# Patient Record
Sex: Male | Born: 1945 | Race: White | Hispanic: No | Marital: Married | Smoking: Never smoker
Health system: Southern US, Community
[De-identification: ages and names within clinical notes are randomized; demographics above are authoritative.]

## PROBLEM LIST (undated history)

## (undated) DIAGNOSIS — F419 Anxiety disorder, unspecified: Secondary | ICD-10-CM

## (undated) DIAGNOSIS — F319 Bipolar disorder, unspecified: Secondary | ICD-10-CM

## (undated) DIAGNOSIS — F32A Depression, unspecified: Secondary | ICD-10-CM

## (undated) DIAGNOSIS — E782 Mixed hyperlipidemia: Secondary | ICD-10-CM

## (undated) DIAGNOSIS — N189 Chronic kidney disease, unspecified: Secondary | ICD-10-CM

## (undated) DIAGNOSIS — F3181 Bipolar II disorder: Secondary | ICD-10-CM

## (undated) DIAGNOSIS — E785 Hyperlipidemia, unspecified: Secondary | ICD-10-CM

## (undated) HISTORY — DX: Mixed hyperlipidemia: E78.2

## (undated) HISTORY — DX: Bipolar disorder, unspecified: F31.9

## (undated) HISTORY — PX: INGUINAL HERNIA REPAIR: SUR1180

## (undated) HISTORY — PX: APPENDECTOMY: SHX54

---

## 2004-08-05 ENCOUNTER — Ambulatory Visit: Payer: Self-pay | Admitting: Cardiology

## 2005-06-01 ENCOUNTER — Ambulatory Visit: Payer: Self-pay | Admitting: Cardiology

## 2005-06-01 ENCOUNTER — Ambulatory Visit: Payer: Self-pay

## 2006-06-21 ENCOUNTER — Ambulatory Visit: Payer: Self-pay | Admitting: Cardiology

## 2007-06-20 ENCOUNTER — Ambulatory Visit: Payer: Self-pay | Admitting: Cardiology

## 2008-06-18 ENCOUNTER — Ambulatory Visit: Payer: Self-pay | Admitting: Cardiology

## 2008-06-18 ENCOUNTER — Ambulatory Visit: Payer: Self-pay

## 2009-05-02 ENCOUNTER — Telehealth: Payer: Self-pay | Admitting: Cardiology

## 2009-05-05 DIAGNOSIS — E782 Mixed hyperlipidemia: Secondary | ICD-10-CM | POA: Insufficient documentation

## 2009-05-06 ENCOUNTER — Encounter: Payer: Self-pay | Admitting: Cardiology

## 2009-05-06 ENCOUNTER — Encounter (INDEPENDENT_AMBULATORY_CARE_PROVIDER_SITE_OTHER): Payer: Self-pay | Admitting: *Deleted

## 2009-05-06 LAB — CONVERTED CEMR LAB
BUN: 17 mg/dL
Creatinine, Ser: 1.43 mg/dL
Glucose, Bld: 88 mg/dL
HCT: 43.3 %
Hemoglobin: 14.9 g/dL
PSA: 0.97 ng/mL
Potassium: 4.3 meq/L
Triglyceride fasting, serum: 31 mg/dL
Vit D, 25-Hydroxy: 49.8 ng/mL

## 2009-05-08 ENCOUNTER — Encounter (INDEPENDENT_AMBULATORY_CARE_PROVIDER_SITE_OTHER): Payer: Self-pay | Admitting: *Deleted

## 2009-05-14 ENCOUNTER — Telehealth: Payer: Self-pay | Admitting: Cardiology

## 2009-05-20 ENCOUNTER — Encounter: Payer: Self-pay | Admitting: Cardiology

## 2009-05-26 ENCOUNTER — Telehealth: Payer: Self-pay | Admitting: Cardiology

## 2009-06-06 ENCOUNTER — Telehealth: Payer: Self-pay | Admitting: Cardiology

## 2009-06-18 ENCOUNTER — Ambulatory Visit: Payer: Self-pay | Admitting: Cardiology

## 2009-06-20 ENCOUNTER — Encounter: Payer: Self-pay | Admitting: Cardiology

## 2009-08-21 ENCOUNTER — Encounter (INDEPENDENT_AMBULATORY_CARE_PROVIDER_SITE_OTHER): Payer: Self-pay | Admitting: *Deleted

## 2009-08-25 ENCOUNTER — Ambulatory Visit: Payer: Self-pay | Admitting: Gastroenterology

## 2009-09-04 ENCOUNTER — Encounter: Payer: Self-pay | Admitting: Cardiology

## 2009-09-05 ENCOUNTER — Ambulatory Visit: Payer: Self-pay | Admitting: Gastroenterology

## 2009-10-17 ENCOUNTER — Telehealth: Payer: Self-pay | Admitting: Cardiology

## 2010-05-18 ENCOUNTER — Telehealth: Payer: Self-pay | Admitting: Cardiology

## 2010-06-22 ENCOUNTER — Encounter: Payer: Self-pay | Admitting: Cardiology

## 2010-06-22 ENCOUNTER — Telehealth: Payer: Self-pay | Admitting: Cardiology

## 2010-07-13 ENCOUNTER — Encounter: Payer: Self-pay | Admitting: Cardiology

## 2010-08-06 ENCOUNTER — Encounter (HOSPITAL_COMMUNITY)
Admission: RE | Admit: 2010-08-06 | Discharge: 2010-10-03 | Payer: Self-pay | Source: Home / Self Care | Attending: Cardiology | Admitting: Cardiology

## 2010-08-06 ENCOUNTER — Encounter: Payer: Self-pay | Admitting: *Deleted

## 2010-08-06 ENCOUNTER — Ambulatory Visit: Payer: Self-pay | Admitting: Cardiology

## 2010-08-06 ENCOUNTER — Ambulatory Visit: Payer: Self-pay

## 2010-08-07 ENCOUNTER — Telehealth: Payer: Self-pay | Admitting: Cardiology

## 2010-11-04 NOTE — Progress Notes (Signed)
Summary: talk to nurse  Phone Note Call from Patient Call back at Home Phone [phone]   Caller: Patient Reason for Call: Talk to Nurse Summary of Call: Pt would like to speak with nurse. personal issue about meds. [phone] or [phone] Initial call taken by: Edman Circle,  October 17, 2009 10:31 AM    Prescriptions: LEVITRA 20 MG TABS (VARDENAFIL HCL)   #30 x 0   Entered by:   Scherrie Bateman, LPN   Authorized by:   Gaylord Shih, MD, [facility]   Signed by:   Scherrie Bateman, LPN on 57/84/6962   Method used:   Faxed to ...       [facility] Environmental education officer)       [address] [address]       [place], [place]  [address]       Ph: [phone]       Fax: [phone]   RxID:   [id number]   Appended Document: talk to nurse needed refill done through [facility].

## 2010-11-04 NOTE — Progress Notes (Signed)
Summary: qx about meds  Phone Note Call from Patient   Summary of Call: pt has a question concerning his medications [phone] Initial call taken by: Edman Circle,  August 07, 2010 8:59 AM  Follow-up for Phone Call        Dr. Tawanna Sat calls today for a new Lovaza prescription to [facility].  Prescription sent. Mylo Red RN

## 2010-11-04 NOTE — Assessment & Plan Note (Signed)
Summary: Cardiology Nuclear Testing  Nuclear Med Background Indications for Stress Test: Evaluation for Ischemia   History: COPD, GXT, Myocardial Perfusion Study  History Comments: 9/10 GXT: NL. 06/18/08 MPS: NL,EF=65%.  Symptoms: DOE, Palpitations    Nuclear Pre-Procedure Cardiac Risk Factors: Family History - CAD, Lipids Caffeine/Decaff Intake: None NPO After: 8:00 AM Lungs: clear IV 0.9% NS with Angio Cath: 22g     IV Site: R Hand IV Started by: Doyne Keel, CNMT Chest Size (in) 40     Height (in): 70 Weight (lb): 157 BMI: 22.61  Nuclear Med Study 1 or 2 day study:  1 day     Stress Test Type:  Stress Reading MD:  Cassell Clement, MD     Referring MD:  T.Wall Resting Radionuclide:  Technetium 27m Tetrofosmin     Resting Radionuclide Dose:  11 mCi  Stress Radionuclide:  Technetium 110m Tetrofosmin     Stress Radionuclide Dose:  32.9 mCi   Stress Protocol Exercise Time (min):  13:35 min     Max HR:  173 bpm     Predicted Max HR:  154 bpm  Max Systolic BP: 205 mm Hg     Percent Max HR:  112.34 %     METS: 16.4 Rate Pressure Product:  14782    Stress Test Technologist:  Milana Na, EMT-P     Nuclear Technologist:  Doyne Keel, CNMT  Rest Procedure  Myocardial perfusion imaging was performed at rest 45 minutes following the intravenous administration of Technetium 8m Tetrofosmin.  Stress Procedure  The patient exercised for 13:35. The patient stopped due to fatigue and denied any chest pain.  There were no significant ST-T wave changes and rare pvcs.  Technetium 76m Tetrofosmin was injected at peak exercise and myocardial perfusion imaging was performed after a brief delay.  QPS Raw Data Images:  Normal; no motion artifact; normal heart/lung ratio. Stress Images:  Normal homogeneous uptake in all areas of the myocardium. Rest Images:  Normal homogeneous uptake in all areas of the myocardium. Subtraction (SDS):  No evidence of ischemia. Transient Ischemic  Dilatation:  0.95  (Normal <1.22)  Lung/Heart Ratio:  0.26  (Normal <0.45)  Quantitative Gated Spect Images QGS EDV:  72 ml QGS ESV:  28 ml QGS EF:  61 % QGS cine images:  Normal LV systolic function.  No wall motion abnormalities.  Findings Normal nuclear study      Overall Impression  Exercise Capacity: Excellent exercise capacity. BP Response: Normal blood pressure response. Clinical Symptoms: No chest pain ECG Impression: No significant ST segment change suggestive of ischemia. Overall Impression: Normal stress nuclear study.  Appended Document: Cardiology Nuclear Testing DISCUSSED IN OFFICE WITH PT, CALL MONDAY TO GIVE FINAL REPORT.  Appended Document: Cardiology Nuclear Testing Pt aware of results. Mylo Red RN

## 2010-11-04 NOTE — Progress Notes (Signed)
Summary: set up stress test prior to appt with tw  Phone Note Call from Patient Call back at [facility] Phone [phone]   Caller: office manager- sally [phone] or [phone] ext [phone] Reason for Call: Talk to Nurse Details for Reason: pt need to have stress test prior to appt with tw in sept . Initial call taken by: Lorne Skeens,  May 18, 2010 11:33 AM  Follow-up for Phone Call        set up exercise myoview day of office visit. Follow-up by: Gaylord Shih, MD, [facility],  May 18, 2010 12:14 PM     Appended Document: set up stress test prior to appt with tw    Clinical Lists Changes  Orders: Added new Referral order of Nuclear Stress Test (Nuc Stress Test) - Signed

## 2010-11-04 NOTE — Letter (Signed)
Summary: [facility]  [facility]   Imported By: Marylou Mccoy 08/04/2010 15:17:16  _____________________________________________________________________  External Attachment:    Type:   Image     Comment:   External Document

## 2010-11-04 NOTE — Progress Notes (Signed)
Summary: Pt request call   Phone Note Call from Patient Call back at [phone] or [phone]   Summary of Call: Pt request call Initial call taken by: Judie Grieve,  June 22, 2010 8:54 AM  Follow-up for Phone Call        [name] will fax his [facility] form to [place] today for renewal of his Lovaza. Mylo Red RN

## 2010-11-04 NOTE — Assessment & Plan Note (Signed)
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  He continues to exercise on a regular basis. He is having no symptoms of angina or ischemia. Recent blood work shows his lipids to be at goal. Please see electronic medical entry. I reviewed this with the patient today.  His extremities comply with his meds and diet.  Nuclear stress study today demonstrate him to exercise for 16.4 metastases, exercise time 13 minutes and 35 seconds, peak heart rate 173 which is 112% of predicted maximum heart rate., normal blood pressure response, and normal perfusion. EF 61%  Allergies: 1)  ! Tetracycline  Review of Systems       negative other than history of present illness   Impression & Recommendations:  Problem # 1:  CORONARY ARTERY DISEASE, FAMILY HX (ICD-V17.3) Assessment Unchanged  Problem # 2:  MIXED HYPERLIPIDEMIA (ICD-272.2) Assessment: Improved  His updated medication list for this problem includes:    Advicor 500-20 Mg Xr24h-tab (Niacin-lovastatin) .Marland Kitchen... Take 2 tablet by mouth once a day    Lovaza 1 Gm Caps (Omega-3-acid ethyl esters) .Marland Kitchen... Take 1 capsule by mouth twice a day    Zetia 10 Mg Tabs (Ezetimibe) .Marland Kitchen... Take 1 tablet by mouth once a day  Patient Instructions: 1)  Your physician recommends that you schedule a follow-up appointment in: 1 year with DR. Wall 2)  Your physician recommends that you continue on your current medications as directed. Please refer to the Current Medication list given to you today.

## 2010-11-04 NOTE — Medication Information (Signed)
Summary: Prescription Refill Request   Prescription Refill Request   Imported By: Roderic Ovens 07/01/2010 14:16:54  _____________________________________________________________________  External Attachment:    Type:   Image     Comment:   External Document

## 2011-02-16 NOTE — Assessment & Plan Note (Signed)
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   I saw him last June 21, 2006.   He is doing remarkably well currently.  He has been on Lamictal for his  history of obsessive-compulsive disorder.  He is doing remarkably well  working full time and exercising 5-6 days a week.  He is having no chest  tightness or symptoms of ischemia.   He recently checked his blood work and everything was stable.  His  lipids were still at goal.   CURRENT MEDICATIONS:  1. Aspirin 325 a day.  2. Vitamin C 500 mg a day.  3. Multivitamin daily.  4. Advicor 500 mg two nightly.  5. Zetia 10 mg a day.  6. Asmanex 220 mcg one puff nightly.  7. Lamictal 200 mg a day.  8. Klonopin 0.5 nightly.  9. Vitamin D 50,000 units weekly for 3 months.   His blood pressure today was 155/95 before his stress test.  His pulse  is 93 and regular, his weight is 158.  HEENT:  Normocephalic/atraumatic, PERRLA, extraocular movements intact.  He has several areas that are being treated now for actinic keratoses  with red patches.  Carotid upstrokes were equal bilaterally without  bruits, no JVD, thyroid is not enlarged, trachea is midline.  LUNGS:  Clear.  HEART:  Reveals a regular rate and rhythm without gallop.  ABDOMINAL:  Soft, good bowel sounds, no midline bruit, no hepatomegaly.  EXTREMITIES:  Reveal no cyanosis, clubbing, or edema.  Pulses are  intact.   We performed an exercise treadmill today.  He exercised for 12 minutes  reaching a peak heart rate of 164 beats per minute.  His blood pressure  increased to 195/79.  He had no symptoms of ischemia or angina.  Test  was stopped secondary to  reaching adequate endpoint.  He had some  upsloping in 2, 3 and aVF which he has had before.  These normalized  within a minute of recovery.   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  His exercise treadmill is  negative adequate.  His lipids are being treated appropriately and are  at goal.  His blood pressure was up today but he has not had a history  of hypertension.   Will plan on seeing him back in a year, at that time he will need an  exercise Myoview.     William C. Daleen Squibb, MD, [facility]  Electronically Signed    [name]/MedQ  DD: 06/20/2007  DT: 06/21/2007  Job #: [id number]

## 2011-02-16 NOTE — Assessment & Plan Note (Signed)
[facility]                            CARDIOLOGY OFFICE NOTE   NAME:Holland, Johneric J DR.                  MRN:          [id number]  DATE:06/18/2008                            DOB:          1945/12/29    William Krueger comes in today for followup.  He is having no symptoms of angina  or ischemia.  He still exercise on a regular basis.  He does return from  a cruise and says he ate too much and drank too much wine.  His LFTs  were slightly up on blood work that he checked at a value of 51 and 46  for SGOT and SGPT respectively.  He plans to repeat these same.  Of  note, the rest of his comprehensive metabolic panel was normal except  for slight elevation in bilirubin at 1.4.  His CPK-MB as were normal.  His thyroid was normal.  CBC was normal except for an MCV of 100.8.  PSA  was normal.  UA was normal.  His total cholesterol was 133,  triglycerides 67, HDL 49, LDL 71.   MEDICATIONS:  1. Aspirin 325 mg a day.  2. Vitamin C 500 mg a day.  3. Multivitamin daily.  4. Advicor 500/20 q.h.s.  5. Zetia 10 mg a day.  6. Klonopin 0.5 mg q.h.s.  7. Vitamin D 50,000 International Units weekly for 3 months.  8. Asmanex 220 mcg 1 every other day.  9. Lamictal 250 mg daily.   PHYSICAL EXAMINATION:  GENERAL:  He is in no acute distress.  VITAL SIGNS:  Blood pressure is 110/78, his pulse 78 and regular.  His  weight is 158, which is stable.  HEENT:  He has some facial plethora, otherwise, negative.  NECK:  Carotids are full.  There is no bruits.  Thyroid is not enlarged.  Trachea is midline.  LUNGS:  Clear to auscultation.  HEART:  Reveals regular rate and rhythm.  No gallop.  ABDOMEN:  Soft.  Good bowel sounds.  No midline bruit.  EXTREMITIES:  No cyanosis, clubbing, or edema.  Pulses are intact.  NEUROLOGIC:  Intact.   His stress Myoview today, he exercised for total of 11 minutes and 59  seconds.  Peak heart rate of 166 which is 106% of predicted maximum  heart  rate.  Met level achieved was 13.4.  His blood pressure response  was normal and came down appropriately in recovery.  His EF is 65%.  I  see well hyperperfused areas with exercise.  We will have an official  read by our nuclear reader today.   ASSESSMENT AND PLAN:  Shahmeer is doing well.  He had a repeat blood work  here in few weeks.  He will let me know if there is any problems.  I  will plan on seeing him back again in a year.     Jerson C. Daleen Squibb, MD, [facility]  Electronically Signed    [name]/MedQ  DD: 06/18/2008  DT: 06/19/2008  Job #: [id number]

## 2011-02-19 NOTE — Assessment & Plan Note (Signed)
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  He has been under a lot  of stress with the local situation with his hospital in [place], [place].  Quite frankly, he has been depressed, according to he and his wife, and is  soon to elicit some consultation from a physician at [facility].  I have  reinforced this and encouraged him to do this today.   He has been having chest tightness but, ironically, gets better with  exercise.   His labs were sent to me by him and were excellent.  I do not have a copy in  the chart today.  However, his lipids look the best they have looked in some  time.   His meds are outlined and are really unchanged.  He is taking some Ambien CR  at night to help him rest.  He is also taking some Asmanex 220 mcg, 1 each  evening.   His blood pressure today is 120/72.  His pulse is 84 and regular.  His  weight is 158.  He is down 7 pounds.  Carotid upstrokes were equal bilaterally without bruits.  No JVD.  Thyroid  is not enlarged.  Trachea is midline.  LUNGS:  Clear.  HEART:  Reveals a regular rate and rhythm.  ABDOMEN:  Soft.  EXTREMITIES:  He has no cyanosis, clubbing, or edema.  Pulses are intact.   His baseline EKG is normal.   He exercised on a Bruce protocol for 9 minutes and 23 seconds.  Met level  achieved was 10.7.  Peak heart rate was 171 which is 108% of predicted  maximum heart rate.  Blood pressure increased to 183/74.   He had no chest pain.  Exercise was stopped secondary to reaching an over  than adequate endpoint.  He said he could have gone further.  He had some  upsloping ST segment changes which are old.  These normalized  very quickly  in recovery.   ASSESSMENT AND PLAN:  I think [name] is doing well.  I see no evidence  of obstructive coronary disease or symptomatic coronary disease.  I am  delighted with his labs.  I have  encouraged him to get some support with his stress at the present time.  I  will see him back in a year.                               Clarice C. Daleen Squibb, MD, [facility]    [name]/MedQ  DD:  06/21/2006  DT:  06/22/2006  Job #:  [id number]

## 2011-04-26 ENCOUNTER — Telehealth: Payer: Self-pay | Admitting: Cardiology

## 2011-04-26 NOTE — Telephone Encounter (Signed)
Pt has a question re zetia

## 2011-04-27 MED ORDER — OMEGA-3-ACID ETHYL ESTERS 1 G PO CAPS: 1.0000 g | ORAL_CAPSULE | Freq: Four times a day (QID) | ORAL | Status: DC

## 2011-04-27 MED ORDER — NIACIN-LOVASTATIN ER 500-20 MG PO TB24: 2.0000 | ORAL_TABLET | Freq: Every day | ORAL | Status: DC

## 2011-04-27 MED ORDER — OMEGA-3-ACID ETHYL ESTERS 1 G PO CAPS: 2.0000 g | ORAL_CAPSULE | Freq: Every day | ORAL | Status: DC

## 2011-04-27 MED ORDER — EZETIMIBE 10 MG PO TABS: 10.0000 mg | ORAL_TABLET | Freq: Every day | ORAL | Status: DC

## 2011-04-27 NOTE — Telephone Encounter (Signed)
Pt calls for refill on Zetia to be sent to [facility].  He does not express any cardiac problems at this time.  Also would like to know if Dr. Daleen Squibb wants him to have a stress test in October.  I will forward to Dr. Daleen Squibb for review

## 2011-05-18 ENCOUNTER — Telehealth: Payer: Self-pay | Admitting: Cardiology

## 2011-05-18 NOTE — Telephone Encounter (Signed)
I spoke with pt office manager, Kennon Rounds.  Pt has appt scheduled for 08/17/11 at 4pm with Dr. Daleen Squibb Pt would like to know if he needs to have stress test done that day. I will forward to Dr. Daleen Squibb for review. Mylo Red RN

## 2011-05-18 NOTE — Telephone Encounter (Signed)
Per pt office manager calling. Pt is scheduling appt w/ Dr. Daleen Squibb and wants to know if he needs to have a stress test done on the same day as his scheduled appt. Please return pt call to advise/discuss.

## 2011-05-19 NOTE — Telephone Encounter (Signed)
If asymptomatic is not a stress test this year.

## 2011-06-01 ENCOUNTER — Telehealth: Payer: Self-pay | Admitting: Cardiology

## 2011-06-01 NOTE — Telephone Encounter (Signed)
William Krueger wants to know if pt needs a stess test before his appt with dr wall.

## 2011-06-03 NOTE — Telephone Encounter (Signed)
Spoke with Kennon Rounds in Dr. Colin Ina office. She states that pt has not been having any angina or chest discomfort since last seen by Dr. Daleen Squibb.  At this time no stress is ordered per Dr. Daleen Squibb recommendation. Mylo Red RN

## 2011-07-07 ENCOUNTER — Other Ambulatory Visit: Payer: Self-pay | Admitting: Cardiology

## 2011-07-21 ENCOUNTER — Encounter: Payer: Self-pay | Admitting: Cardiology

## 2011-08-17 ENCOUNTER — Ambulatory Visit: Payer: Self-pay | Admitting: Cardiology

## 2011-09-08 ENCOUNTER — Encounter: Payer: Self-pay | Admitting: *Deleted

## 2011-09-09 ENCOUNTER — Encounter: Payer: Self-pay | Admitting: Cardiology

## 2011-09-09 ENCOUNTER — Ambulatory Visit (INDEPENDENT_AMBULATORY_CARE_PROVIDER_SITE_OTHER): Payer: Medicare Other | Admitting: Cardiology

## 2011-09-09 VITALS — BP 130/82 | HR 75 | Ht 70.0 in | Wt 165.0 lb

## 2011-09-09 DIAGNOSIS — E782 Mixed hyperlipidemia: Secondary | ICD-10-CM

## 2011-09-09 NOTE — Progress Notes (Signed)
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 His has a  family history and also has a history of hyperlipidemia.  He takes remarkably good care of himself. He still exercises on a regular basis. His ideal body weight. His last lipid panel showed a cholesterol of 122, fascial 44, LDL 63, triglycerides of 73. His other blood work was reviewed and was unremarkable. LFTs were not checked this past time.  He denies any symptoms of chest discomfort, angina, or other ischemic equivalents.     Past Medical History  Diagnosis Date  . Mixed hyperlipidemia     Current Outpatient Prescriptions  Medication Sig Dispense Refill  . clonazePAM (KLONOPIN) 0.5 MG tablet Take 0.5 mg by mouth at bedtime as needed.        . ezetimibe (ZETIA) 10 MG tablet Take 1 tablet (10 mg total) by mouth daily.  90 tablet  3  . LOVAZA 1 G capsule TAKE 1 CAPSULE BY MOUTH 4 TIMES DAILY  360 capsule  2  . niacin-lovastatin (ADVICOR) 500-20 MG 24 hr tablet Take 2 tablets by mouth at bedtime.        Allergies  Allergen Reactions  . Depakote   . Tetracycline     Family History  Problem Relation Age of Onset  . Coronary artery disease      family history    History   Social History  . Marital Status: Married    Spouse Name: N/A    Number of Children: N/A  . Years of Education: N/A   Occupational History  . Not on file.   Social History Main Topics  . Smoking status: Never Smoker   . Smokeless tobacco: Never Used  . Alcohol Use: No  . Drug Use: No  . Sexually Active: Not on file   Other Topics Concern  . Not on file   Social History Narrative  . No narrative on file    ROS ALL NEGATIVE EXCEPT THOSE NOTED IN HPI  PE  General Appearance: well developed, well nourished in no acute distress HEENT: symmetrical face, PERRLA, good dentition  Neck: no JVD, thyromegaly, or adenopathy, trachea midline Chest: symmetric without deformity Cardiac: PMI  non-displaced, RRR, normal S1, S2, no gallop or murmur Lung: clear to ausculation and percussion Vascular: all pulses full without bruits  Abdominal: nondistended, nontender, good bowel sounds, no HSM, no bruits Extremities: no cyanosis, clubbing or edema, no sign of DVT, no varicosities  Skin: normal color, no rashes Neuro: alert and oriented x 3, non-focal Pysch: normal affect  EKG Normal sinus rhythm, normal EKG BMET    Component Value Date/Time   NA 136 05/06/2009   K 4.3 05/06/2009   CL 101 05/06/2009   CO2 26 05/06/2009   GLUCOSE 88 05/06/2009   BUN 17 05/06/2009   CREATININE 1.43 05/06/2009   CALCIUM 8.9 05/06/2009    Lipid Panel     Component Value Date/Time   CHOL 113 05/06/2009   HDL 52 05/06/2009   LDLCALC 55 05/06/2009    CBC    Component Value Date/Time   WBC 5.4 05/06/2009   RBC 4.38 05/06/2009   HGB 14.9 05/06/2009   HCT 43.3 05/06/2009   PLT 199 05/06/2009

## 2011-09-09 NOTE — Patient Instructions (Signed)
Your physician recommends that you schedule a follow-up appointment with Dr. Daleen Squibb in 1 year.

## 2011-09-09 NOTE — Assessment & Plan Note (Signed)
Under remarkably good control with diet, statin and niacin, and exercise. No changes in therapy. He will need to check LFTs on an annual basis.

## 2011-11-08 ENCOUNTER — Other Ambulatory Visit: Payer: Self-pay | Admitting: Cardiology

## 2012-01-28 ENCOUNTER — Telehealth: Payer: Self-pay | Admitting: Cardiology

## 2012-01-28 NOTE — Telephone Encounter (Signed)
[name] calls for information on CV side effects with possible new drug for him known as latuda. Have talked with Weston Brass and she has reviewed the literature. CV side effects could include orthostatic hypotension (6.3-2.1 %), syncope (0.1%), and it may elevate cholesterol. Will call pt back with this information. LMTCB no answer at [facility], or cell phone Mylo Red RN

## 2012-01-28 NOTE — Telephone Encounter (Signed)
Pt aware of results Debbie Aundra Pung RN  

## 2012-01-28 NOTE — Telephone Encounter (Signed)
LMTCB Debbie Glorie Dowlen RN  

## 2012-01-28 NOTE — Telephone Encounter (Signed)
New problem:  Per Patsy calling from [facility]. Was ask to  call the office & have the Dr. Daleen Squibb nurse to call him back. No information was given was to Patsy.

## 2012-03-09 ENCOUNTER — Other Ambulatory Visit: Payer: Self-pay | Admitting: Cardiology

## 2012-03-13 ENCOUNTER — Encounter: Payer: Self-pay | Admitting: Cardiology

## 2012-05-11 DIAGNOSIS — F3181 Bipolar II disorder: Secondary | ICD-10-CM | POA: Insufficient documentation

## 2012-05-31 ENCOUNTER — Telehealth: Payer: Self-pay | Admitting: Cardiology

## 2012-05-31 NOTE — Telephone Encounter (Signed)
Yearly appt with Dr. Daleen Squibb made for 09/14/12 at 3pm He is not having any cardiac complaints at this time Stress test not ordered Mylo Red RN

## 2012-05-31 NOTE — Telephone Encounter (Signed)
Or [phone], pt's secretary calling to make appt, wants to know if he needs a stress test at this visit? pls call

## 2012-08-10 ENCOUNTER — Other Ambulatory Visit: Payer: Self-pay | Admitting: Cardiology

## 2012-08-10 MED ORDER — NIACIN-LOVASTATIN ER 500-20 MG PO TB24: 2.0000 | ORAL_TABLET | Freq: Every day | ORAL | Status: DC

## 2012-09-07 ENCOUNTER — Encounter: Payer: Self-pay | Admitting: *Deleted

## 2012-09-14 ENCOUNTER — Encounter: Payer: Self-pay | Admitting: Cardiology

## 2012-09-14 ENCOUNTER — Ambulatory Visit (INDEPENDENT_AMBULATORY_CARE_PROVIDER_SITE_OTHER): Payer: Medicare Other | Admitting: Cardiology

## 2012-09-14 VITALS — BP 110/78 | HR 64 | Ht 70.0 in | Wt 163.0 lb

## 2012-09-14 DIAGNOSIS — E782 Mixed hyperlipidemia: Secondary | ICD-10-CM

## 2012-09-14 NOTE — Assessment & Plan Note (Signed)
His numbers are at goal. Continue current medical program and healthy lifestyle.

## 2012-09-14 NOTE — Patient Instructions (Addendum)
Your physician recommends that you continue on your current medications as directed. Please refer to the Current Medication list given to you today.   Your physician wants you to follow-up in: 1 year with Dr. Wall. You will receive a reminder letter in the mail two months in advance. If you don't receive a letter, please call our office to schedule the follow-up appointment.  

## 2012-09-14 NOTE — Progress Notes (Signed)
HPI William Krueger comes in today for evaluation and management of his history of hyperlipidemia and family history of coronary disease.  He has developed several hobbies including flying lessons and pistol shooting. He looks the best I've seen him in some time. He still exercises on a regular basis. 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  He denies angina or ischemic symptoms.  Recent blood work demonstrated a total cholesterol of 117 triglycerides 55 HDL 58 LDL of 48. LFTs were normal. Total CK was 291 but he  exercises on regular basis.  Past Medical History  Diagnosis Date  . Mixed hyperlipidemia     Current Outpatient Prescriptions  Medication Sig Dispense Refill  . clonazePAM (KLONOPIN) 0.5 MG tablet Take 0.5 mg by mouth at bedtime as needed.        . divalproex (DEPAKOTE ER) 500 MG 24 hr tablet Take 500 mg by mouth daily.      Marland Kitchen LOVAZA 1 G capsule TAKE 1 CAPSULE BY MOUTH 4 TIMES DAILY  360 capsule  2  . niacin-lovastatin (ADVICOR) 500-20 MG 24 hr tablet Take 2 tablets by mouth daily.  180 tablet  0  . ZETIA 10 MG tablet TAKE 1 TABLET BY MOUTH DAILY  90 tablet  2    Allergies  Allergen Reactions  . Tetracycline     Family History  Problem Relation Age of Onset  . Coronary artery disease      family history  . Hyperlipidemia      family history    History   Social History  . Marital Status: Married    Spouse Name: N/A    Number of Children: N/A  . Years of Education: N/A   Occupational History  . Not on file.   Social History Main Topics  . Smoking status: Never Smoker   . Smokeless tobacco: Never Used  . Alcohol Use: No  . Drug Use: No  . Sexually Active: Not on file   Other Topics Concern  . Not on file   Social History Narrative  . No narrative on file    ROS ALL NEGATIVE EXCEPT THOSE NOTED IN HPI  PE  General Appearance: well developed, well nourished in no acute distress HEENT: symmetrical face, PERRLA,  good dentition  Neck: no JVD, thyromegaly, or adenopathy, trachea midline Chest: symmetric without deformity Cardiac: PMI non-displaced, RRR, normal S1, S2, no gallop or murmur Lung: clear to ausculation and percussion Vascular: all pulses full without bruits  Abdominal: nondistended, nontender, good bowel sounds, no HSM, no bruits Extremities: no cyanosis, clubbing or edema, no sign of DVT, no varicosities  Skin: normal color, no rashes Neuro: alert and oriented x 3, non-focal Pysch: normal affect  EKG Normal sinus rhythm, normal EKG  BMET    Component Value Date/Time   NA 136 05/06/2009   K 4.3 05/06/2009   CL 101 05/06/2009   CO2 26 05/06/2009   GLUCOSE 88 05/06/2009   BUN 17 05/06/2009   CREATININE 1.43 05/06/2009   CALCIUM 8.9 05/06/2009    Lipid Panel     Component Value Date/Time   CHOL 113 05/06/2009   HDL 52 05/06/2009   LDLCALC 55 05/06/2009    CBC    Component Value Date/Time   WBC 5.4 05/06/2009   RBC 4.38 05/06/2009   HGB 14.9 05/06/2009   HCT 43.3 05/06/2009   PLT 199 05/06/2009

## 2012-11-12 ENCOUNTER — Other Ambulatory Visit: Payer: Self-pay | Admitting: Cardiology

## 2013-01-04 ENCOUNTER — Other Ambulatory Visit: Payer: Self-pay | Admitting: Cardiology

## 2013-03-23 ENCOUNTER — Encounter: Payer: Self-pay | Admitting: *Deleted

## 2013-09-20 ENCOUNTER — Ambulatory Visit (INDEPENDENT_AMBULATORY_CARE_PROVIDER_SITE_OTHER): Payer: Medicare Other | Admitting: Cardiovascular Disease

## 2013-09-20 ENCOUNTER — Encounter: Payer: Self-pay | Admitting: Cardiovascular Disease

## 2013-09-20 VITALS — BP 121/61 | HR 75 | Ht 70.0 in | Wt 171.8 lb

## 2013-09-20 DIAGNOSIS — E782 Mixed hyperlipidemia: Secondary | ICD-10-CM

## 2013-09-20 MED ORDER — OMEGA-3-ACID ETHYL ESTERS 1 G PO CAPS: ORAL_CAPSULE | ORAL | Status: DC

## 2013-09-20 MED ORDER — VARDENAFIL HCL 20 MG PO TABS: 20.0000 mg | ORAL_TABLET | Freq: Every day | ORAL | Status: DC | PRN

## 2013-09-20 NOTE — Patient Instructions (Signed)
Your physician wants you to follow-up in: 1 YEAR with Dr Cooper.  You will receive a reminder letter in the mail two months in advance. If you don't receive a letter, please call our office to schedule the follow-up appointment.  Your physician recommends that you continue on your current medications as directed. Please refer to the Current Medication list given to you today.  

## 2013-09-20 NOTE — Progress Notes (Signed)
    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 He has been followed by Dr. Daleen Squibb for over 20 years. He's been maintained on aggressive lipid-lowering program. He has no personal history of coronary artery disease. He brings in recent lab work demonstrating a creatinine of 1.1, fasting glucose of 79, CPK of 39, cholesterol 98, triglycerides 74, HDL 47, and LDL 36. His liver function tests were within normal limits.  The patient exercises regularly. He swims for 30 minutes and works out for 40 minutes on exercise machines. He has no symptoms with that level of exertion. He specifically denies chest pain or pressure, dyspnea, edema, or palpitations.  Outpatient Encounter Prescriptions as of 09/20/2013  Medication Sig  . aspirin 81 MG tablet Take 81 mg by mouth daily.  . Citalopram Hydrobromide (CELEXA PO) Take 30 mg by mouth at bedtime.  . clonazePAM (KLONOPIN) 0.5 MG tablet Take 0.5 mg by mouth at bedtime as needed.    . divalproex (DEPAKOTE ER) 500 MG 24 hr tablet Take 500 mg by mouth daily.  . niacin-lovastatin (ADVICOR) 500-20 MG 24 hr tablet Take 2 tablets by mouth at bedtime.  Marland Kitchen omega-3 acid ethyl esters (LOVAZA) 1 G capsule TAKE 1 CAPSULE BY MOUTH 4 TIMES DAILY  . ZETIA 10 MG tablet TAKE 1 TABLET BY MOUTH DAILY  . [DISCONTINUED] LOVAZA 1 G capsule TAKE 1 CAPSULE BY MOUTH 4 TIMES DAILY  . vardenafil (LEVITRA) 20 MG tablet Take 1 tablet (20 mg total) by mouth daily as needed for erectile dysfunction.    Allergies  Allergen Reactions  . Tetracycline     Past Medical History  Diagnosis Date  . Mixed hyperlipidemia     ROS: Positive for erectile dysfunction, otherwise negative except as per HPI  BP 121/61  Pulse 75  Ht 5\' 10"  (1.778 m)  Wt 171 lb 12.8 oz (77.928 kg)  BMI 24.65 kg/m2  PHYSICAL EXAM: Pt is alert and oriented, NAD HEENT: normal Neck: JVP - normal, carotids 2+= without  bruits Lungs: CTA bilaterally CV: RRR without murmur or gallop Abd: soft, NT, Positive BS, no hepatomegaly Ext: no C/C/E, distal pulses intact and equal Skin: warm/dry no rash  EKG:  Normal sinus rhythm 75 beats per minute, within normal limits.  ASSESSMENT AND PLAN: 1. Hyperlipidemia. Excellent lipid panel on a combination of Lovaza, niacin, lovastatin, and zetia. He will return in one year for followup.  2. Strong family history of premature coronary artery disease. He is asymptomatic at a good level of exercise. I do not see any indication for stress testing. We discussed consideration of a cardiac CT calcium score or carotid scan for evaluation of intimal thickness. However, since he is already on the most aggressive risk reduction program, I do not see any reason to pursue screening for subclinical atherosclerosis at this point.  3. Erectile dysfunction: Prescription for Levitra was written.  Tonny Bollman 09/20/2013 2:35 PM

## 2013-09-28 ENCOUNTER — Telehealth: Payer: Self-pay

## 2013-09-28 NOTE — Telephone Encounter (Signed)
Pharmacy is requesting medication be changed from levitra 20mg  to viagra 50mg . Patient was last seen on December 18th by Dr. Excell Seltzer. Levitra had already been requested day of appt. For 2 refills. Pharmacy wants to be changed.

## 2013-09-28 NOTE — Telephone Encounter (Signed)
The pharmacy is requesting the medication be changed to viagra 50mg . He was seen by doctor cooper on December 18th in office. He was ordered to start Levitra. The script was ordered for 2 refills on the 18th. He is not due for a follow up until 1 year

## 2013-09-29 ENCOUNTER — Other Ambulatory Visit: Payer: Self-pay | Admitting: Cardiology

## 2013-10-01 NOTE — Telephone Encounter (Signed)
Dr Excell Seltzer completed paperwork from [facility] on 09/25/13 in regards to this prescription.  Dr Excell Seltzer changed order from Levitra to Viagra 50mg  take as needed and this was faxed on 09/26/13 to [facility] by medical records. Left message for pt to call back so that he will be aware of medication change.

## 2013-10-02 NOTE — Telephone Encounter (Signed)
I spoke with the pt and made him aware of medication change in ED drug.

## 2013-10-30 ENCOUNTER — Other Ambulatory Visit: Payer: Self-pay | Admitting: Nurse Practitioner

## 2013-10-30 MED ORDER — EZETIMIBE 10 MG PO TABS: ORAL_TABLET | ORAL | Status: DC

## 2013-10-30 MED ORDER — SILDENAFIL CITRATE 50 MG PO TABS: 50.0000 mg | ORAL_TABLET | Freq: Every day | ORAL | Status: DC | PRN

## 2014-05-22 ENCOUNTER — Other Ambulatory Visit: Payer: Self-pay | Admitting: Cardiovascular Disease

## 2014-09-05 ENCOUNTER — Ambulatory Visit: Admitting: Cardiovascular Disease

## 2014-09-17 ENCOUNTER — Other Ambulatory Visit: Payer: Self-pay | Admitting: Cardiovascular Disease

## 2014-10-14 ENCOUNTER — Other Ambulatory Visit: Payer: Self-pay | Admitting: Cardiovascular Disease

## 2014-10-17 ENCOUNTER — Encounter: Payer: Self-pay | Admitting: Cardiovascular Disease

## 2014-10-17 ENCOUNTER — Ambulatory Visit (INDEPENDENT_AMBULATORY_CARE_PROVIDER_SITE_OTHER): Payer: Medicare Other | Admitting: Cardiovascular Disease

## 2014-10-17 VITALS — BP 116/80 | HR 65 | Ht 70.0 in | Wt 175.8 lb

## 2014-10-17 DIAGNOSIS — E782 Mixed hyperlipidemia: Secondary | ICD-10-CM

## 2014-10-17 NOTE — Progress Notes (Signed)
CARDIOLOGY OFFICE NOTE Date:  10/17/2014   ID:  William Krueger, DOB Feb 06, 1946, MRN [id number]  PCP:  No primary care provider on file.   Chief Complaint  Patient presents with  . Hyperlipidemia    NO COMPLAINTS     HISTORY OF PRESENT ILLNESS: William Krueger is a 69 y.o. male who presents today for cardiology evaluation.   He has been followed for hyperlipidemia and strong family history of coronary artery disease. The patient has been maintained on aggressive lipid-lowering program. He has no personal history of coronary artery disease.  He's been managed with Advicor, but apparently production of this medication is being discontinued. He has never had side effects related to his statin medication or lipid-lowering therapies. He continues to do well from a symptomatic perspective. In fact, he has no complaints today. He denies chest pain, shortness of breath, or leg swelling. He swims 3 times per week at a distance of 1000 yards. He has no exertional symptoms.   Past Medical History  Diagnosis Date  . Mixed hyperlipidemia     Current Outpatient Prescriptions  Medication Sig Dispense Refill  . ADVICOR 500-20 MG 24 hr tablet TAKE 2 TABLETS AT BEDTIME (Patient taking differently: TAKE 2 TABLETS BY MOUTH AT BEDTIME) 60 tablet 0  . aspirin 81 MG tablet Take 81 mg by mouth daily.    . Citalopram Hydrobromide (CELEXA PO) Take 30 mg by mouth at bedtime.    . clonazePAM (KLONOPIN) 0.5 MG tablet Take 0.5 mg by mouth at bedtime as needed.      . divalproex (DEPAKOTE ER) 500 MG 24 hr tablet Take 500 mg by mouth daily.    Marland Kitchen omega-3 acid ethyl esters (LOVAZA) 1 G capsule TAKE 1 CAPSULE FOUR TIMES A DAY (Patient taking differently: 2 CAPSULES TWICE DAILY) 120 capsule 0  . sildenafil (VIAGRA) 50 MG tablet Take 1 tablet (50 mg total) by mouth daily as needed for erectile dysfunction. 10 tablet 3  . ZETIA 10 MG tablet TAKE 1 TABLET DAILY (Patient taking differently: TAKE 1 TABLET BY MOUTH DAILY)  90 tablet 0   No current facility-administered medications for this visit.    ALLERGIES:   Tetracycline   SOCIAL HISTORY:  The patient  reports that he has never smoked. He has never used smokeless tobacco. He reports that he does not drink alcohol or use illicit drugs.   FAMILY HISTORY:  The patient's family history includes Coronary artery disease in an other family member; Hyperlipidemia in an other family member.    REVIEW OF SYSTEMS:  Positive for No symptoms.   All other systems are reviewed and negative.   PHYSICAL EXAM: VS:  BP 116/80 mmHg  Pulse 65  Ht  (1.778 m)  Wt 175 lb 12.8 oz (79.742 kg)  BMI 25.22 kg/m2 , BMI Body mass index is 25.22 kg/(m^2). GEN: Well nourished, well developed, in no acute distress HEENT: normal Neck: No JVD. carotids 2+ without bruits or masses Cardiac: The heart is RRR without murmurs, rubs, or gallops. No edema. Pedal pulses 2+ = bilaterally  Respiratory:  clear to auscultation bilaterally GI: soft, nontender, nondistended, + BS MS: no deformity or atrophy Skin: warm and dry, no rash Neuro:  Strength and sensation are intact Psych: euthymic mood, full affect  EKG:  EKG is ordered today. The ekg ordered today shnormal sinus rhythm 65 bpm, within normal limits.  RECENT LABS: No results found for requested labs within last 365 days.  No results  found for requested labs within last 365 days.   CrCl cannot be calculated (Patient has no serum creatinine result on file.).   Wt Readings from Last 3 Encounters:  10/17/14 175 lb 12.8 oz (79.742 kg)  09/20/13 171 lb 12.8 oz (77.928 kg)  09/14/12 163 lb (73.936 kg)     CARDIAC STUDIES:  none  ASSESSMENT AND PLAN: 1. Hyperlipidemia: Advicor no longer in production. Will switch to the individual components. I'm going to put him on a lower dose of short acting niacin to make sure that he tolerates this. He will be started on lovastatin 40 mg and niacin 500 mg daily. Lipids will be  rechecked as he has been doing with his primary physician and he will forward me results.  2. Strong family history of premature CAD. With his hyperlipidemia is an additional risk factor, I have recommended an exercise treadmill non-imaging stress test.   Labs/ tests ordered today include: none  Disposition:   FU with Me in 12 months. Exercise treadmill study ordered.   Enzo BiSigned, Kentavious Michele 10/17/2014 11:25 AM     [facility] [address] [place] [address]  [phone] (office) [phone] (fax)

## 2014-10-17 NOTE — Patient Instructions (Signed)
Your physician has requested that you have an exercise tolerance test. For further information please visit [email or web address]. Please also follow instruction sheet, as given.  Your physician wants you to follow-up in: 1 YEAR with Dr Excell Seltzerooper.  You will receive a reminder letter in the mail two months in advance. If you don't receive a letter, please call our office to schedule the follow-up appointment.  Please send a copy of cholesterol lab results to our office (Fax [phone]).   When you finish your current supply of Advicor please contact our office and we will prescribe: Lovastatin 40mg  take one by mouth every evening Short acting Niacin 500mg  once a day

## 2014-11-22 ENCOUNTER — Ambulatory Visit (INDEPENDENT_AMBULATORY_CARE_PROVIDER_SITE_OTHER): Payer: Medicare Other | Admitting: Nurse Practitioner

## 2014-11-22 ENCOUNTER — Encounter: Payer: Self-pay | Admitting: Nurse Practitioner

## 2014-11-22 DIAGNOSIS — E782 Mixed hyperlipidemia: Secondary | ICD-10-CM

## 2014-11-22 NOTE — Progress Notes (Signed)
Exercise Treadmill Test  Pre-Exercise Testing Evaluation Rhythm: normal sinus  Rate: 70 bpm     Test  Exercise Tolerance Test Ordering MD: Tonny BollmanMichael Cooper, MD  Interpreting MD: Norma FredricksonLori Gerhardt, NP  Unique Test No: 1  Treadmill:  1  Indication for ETT: family Hx  Contraindication to ETT: No   Stress Modality: exercise - treadmill  Cardiac Imaging Performed: non   Protocol: standard Bruce - maximal  Max BP:  216/69  Max MPHR (bpm):  150 85% MPR (bpm):  128  MPHR obtained (bpm):  148 % MPHR obtained:  98%  Reached 85% MPHR (min:sec):  6:08 Total Exercise Time (min-sec):  9 minutes  Workload in METS:  10.1 Borg Scale: 15  Reason ETT Terminated:  desired heart rate attained    ST Segment Analysis At Rest: normal ST segments - no evidence of significant ST depression With Exercise: no evidence of significant ST depression  Other Information Arrhythmia:  No Angina during ETT:  absent (0) Quality of ETT:  diagnostic  ETT Interpretation:  normal - no evidence of ischemia by ST analysis  Comments: Patient presents today for routine GXT. Has + FH for CAD and HLD. No active symptoms noted. Previous patient of Dr. Vern ClaudeWall's. Today the patient exercised on the standard Bruce protocol for a total of 9 minutes.  Good exercise tolerance.  Adequate blood pressure response.  Clinically negative for chest pain. Test was stopped due to achievement of target HR.  EKG negative for ischemia. Some upsloping ST segments but negative for ischemia. No significant arrhythmia noted.  The test is felt to be negative. EKGs reviewed with Dr. Patty SermonsBrackbill who agrees.    Recommendations: CV risk factor modification  See back prn.  Patient is agreeable to this plan and will call if any problems develop in the interim.   Rosalio MacadamiaLori C. Gerhardt, RN, ANP-C [facility] [address] [place], [place]  [address] [phone]

## 2015-01-02 ENCOUNTER — Encounter: Payer: Self-pay | Admitting: Cardiovascular Disease

## 2015-02-06 ENCOUNTER — Telehealth: Payer: Self-pay | Admitting: Cardiovascular Disease

## 2015-02-06 NOTE — Telephone Encounter (Signed)
New Message       Pt calling stating that he would like to speak to [name]. Please call back and advise.

## 2015-02-06 NOTE — Telephone Encounter (Signed)
Pt would like for [name] to call him tomorrow. Pt  said that he will be in his office ph # [phone].

## 2015-02-07 MED ORDER — OMEGA-3-ACID ETHYL ESTERS 1 G PO CAPS: ORAL_CAPSULE | ORAL | Status: DC

## 2015-02-07 MED ORDER — EZETIMIBE 10 MG PO TABS: 10.0000 mg | ORAL_TABLET | Freq: Every day | ORAL | Status: DC

## 2015-02-07 NOTE — Telephone Encounter (Signed)
I spoke with the pt and he would like refills on Lovaza and Zetia.  The pt states that his lovaza is filled as four daily but he only takes 2 daily at this time.  Prescriptions sent to the pharmacy.

## 2015-02-13 DIAGNOSIS — M23231 Derangement of other medial meniscus due to old tear or injury, right knee: Secondary | ICD-10-CM | POA: Insufficient documentation

## 2015-03-07 ENCOUNTER — Encounter: Payer: Self-pay | Admitting: Gastroenterology

## 2015-04-09 ENCOUNTER — Other Ambulatory Visit: Payer: Self-pay

## 2015-04-09 MED ORDER — NIACIN 500 MG PO TABS: 500.0000 mg | ORAL_TABLET | Freq: Every day | ORAL | Status: DC

## 2015-04-09 MED ORDER — LOVASTATIN 40 MG PO TABS: 40.0000 mg | ORAL_TABLET | Freq: Every day | ORAL | Status: DC

## 2015-05-15 ENCOUNTER — Other Ambulatory Visit: Payer: Self-pay | Admitting: Cardiovascular Disease

## 2015-05-21 ENCOUNTER — Other Ambulatory Visit: Payer: Self-pay

## 2015-05-21 MED ORDER — NIACIN 500 MG PO TABS: 500.0000 mg | ORAL_TABLET | Freq: Every day | ORAL | Status: DC

## 2015-05-21 MED ORDER — LOVASTATIN 40 MG PO TABS: 40.0000 mg | ORAL_TABLET | Freq: Every day | ORAL | Status: DC

## 2015-07-16 ENCOUNTER — Encounter: Payer: Self-pay | Admitting: Cardiovascular Disease

## 2015-12-19 ENCOUNTER — Encounter: Payer: Self-pay | Admitting: Cardiovascular Disease

## 2015-12-19 ENCOUNTER — Ambulatory Visit (INDEPENDENT_AMBULATORY_CARE_PROVIDER_SITE_OTHER): Payer: Medicare Other | Admitting: Cardiovascular Disease

## 2015-12-19 VITALS — BP 128/72 | HR 63 | Ht 70.0 in | Wt 174.0 lb

## 2015-12-19 DIAGNOSIS — E785 Hyperlipidemia, unspecified: Secondary | ICD-10-CM

## 2015-12-19 MED ORDER — EZETIMIBE 10 MG PO TABS: 10.0000 mg | ORAL_TABLET | Freq: Every day | ORAL | Status: DC

## 2015-12-19 NOTE — Progress Notes (Signed)
Cardiology Office Note Date:  12/20/2015   ID:  Shelbie Ammonshomas J Romanoff, DOB 06/10/1946, MRN [id number]  PCP:  Felicie Mornavid Smith, PA  Cardiologist:  Tonny Bollmanooper, Owin Vignola, MD    Chief Complaint  Patient presents with  . Follow-up    Hyperlipidemia    History of Present Illness: William Krueger is a 70 y.o. male who presents for follow-up evaluation. He's followed for hyperlipidemia and family history of CAD. He is managed with an aggressive medical regimen for his hyperlipidemia with lovaza, niacin, lovastatin, and zetia. Most recent labs are reviewed and show LDL at goal of < 70 and HDL is 55.   Today, he denies symptoms of palpitations, chest pain, shortness of breath, orthopnea, PND, lower extremity edema, dizziness, or syncope.  He is active and swims twice/week, also works out with a Systems analystpersonal trainer.   Past Medical History  Diagnosis Date  . Mixed hyperlipidemia     No past surgical history on file.  Current Outpatient Prescriptions  Medication Sig Dispense Refill  . Vitamin D, Ergocalciferol, (DRISDOL) 50000 units CAPS capsule Take 1 capsule by mouth once a week.    Marland Kitchen. aspirin 81 MG tablet Take 81 mg by mouth daily.    . Citalopram Hydrobromide (CELEXA PO) Take 30 mg by mouth at bedtime.    . clonazePAM (KLONOPIN) 0.5 MG tablet Take 0.5 mg by mouth at bedtime as needed.      . divalproex (DEPAKOTE ER) 500 MG 24 hr tablet Take 500 mg by mouth daily.    Marland Kitchen. ezetimibe (ZETIA) 10 MG tablet Take 1 tablet (10 mg total) by mouth daily. 90 tablet 3  . lovastatin (MEVACOR) 40 MG tablet Take 1 tablet (40 mg total) by mouth at bedtime. 90 tablet 3  . niacin 500 MG tablet Take 1 tablet (500 mg total) by mouth at bedtime. 90 tablet 3  . omega-3 acid ethyl esters (LOVAZA) 1 G capsule TAKE 1 CAPSULE FOUR TIMES A DAY 360 capsule 3  . sildenafil (VIAGRA) 50 MG tablet Take 1 tablet (50 mg total) by mouth daily as needed for erectile dysfunction. 10 tablet 3   No current facility-administered medications for  this visit.    Allergies:   Tetracycline   Social History:  The patient  reports that he has never smoked. He has never used smokeless tobacco. He reports that he does not drink alcohol or use illicit drugs.   Family History:  The patient's  family history is not on file.   ROS:  Please see the history of present illness.  Otherwise, review of systems is positive for knee pain.  All other systems are reviewed and negative.   PHYSICAL EXAM: VS:  BP 128/72 mmHg  Pulse 63  Ht 5\' 10"  (1.778 m)  Wt 174 lb (78.926 kg)  BMI 24.97 kg/m2 , BMI Body mass index is 24.97 kg/(m^2). GEN: Well nourished, well developed, in no acute distress HEENT: normal Neck: no JVD, no masses. No carotid bruits Cardiac: RRR without murmur or gallop                Respiratory:  clear to auscultation bilaterally, normal work of breathing GI: soft, nontender, nondistended, + BS MS: no deformity or atrophy Ext: no pretibial edema, pedal pulses 2+= bilaterally Skin: warm and dry, no rash Neuro:  Strength and sensation are intact Psych: euthymic mood, full affect  EKG:  EKG is ordered today. The ekg ordered today shows NSR 63 bpm, within normal limits  Recent Labs:  No results found for requested labs within last 365 days.   Lipid Panel     Component Value Date/Time   CHOL 113 05/06/2009   TRIG 31 05/06/2009   HDL 52 05/06/2009   LDLCALC 55 05/06/2009   Wt Readings from Last 3 Encounters:  12/19/15 174 lb (78.926 kg)  10/17/14 175 lb 12.8 oz (79.742 kg)  09/20/13 171 lb 12.8 oz (77.928 kg)    ASSESSMENT AND PLAN: Hyperlipidemia: the patient is very compliant with exercise, diet, and medical therapy. Lipids at goal and reviewed today - scanned in from his PCP. Continue same Rx and FU in one year. We discussed consideration of testing for subclinical atherosclerosis but opted to continue with medical therapy considering his asymptomatic status.   Current medicines are reviewed with the patient today.   The patient does not have concerns regarding medicines.  Labs/ tests ordered today include:   Orders Placed This Encounter  Procedures  . EKG 12-Lead   Disposition:   FU one year  Signed, Tonny Bollman, MD  12/20/2015 12:10 AM    [facility] [address] [place], [place], [place]  [address] Phone: [phone]; Fax: [phone]

## 2016-06-14 ENCOUNTER — Other Ambulatory Visit: Payer: Self-pay | Admitting: Cardiovascular Disease

## 2016-07-11 ENCOUNTER — Other Ambulatory Visit: Payer: Self-pay | Admitting: Cardiovascular Disease

## 2016-10-18 ENCOUNTER — Telehealth: Payer: Self-pay | Admitting: Cardiovascular Disease

## 2016-10-18 NOTE — Telephone Encounter (Signed)
**Note De-Identified Kristyna Bradstreet Obfuscation** Kennon RoundsSally is advised that Dr Randolm Idoloopers nurse is not in the office today and that she is the one she will need to talk to as Dr Randolm Idoloopers March schedule is full. She verbalized understanding and is aware that I am forwarding this message to Dr Randolm Idoloopers nurse to address when she returns to the office.  Also, I advised Kennon RoundsSally that the pt needs to sign a DPR with her name on it so we can talk with her concerning his appts/care.  Kennon RoundsSally states that she is the pts manager and that she manages his schedule.  Message forwarded to Leotis ShamesLauren, Dr Randolm Idoloopers nurse.

## 2016-10-18 NOTE — Telephone Encounter (Signed)
Phone is busy. Will continue to call. 

## 2016-10-18 NOTE — Telephone Encounter (Signed)
New message    Pt wife verbalized that she is calling for a March appt with Dr.Cooper

## 2016-11-15 ENCOUNTER — Encounter: Payer: Self-pay | Admitting: Cardiovascular Disease

## 2016-11-15 NOTE — Telephone Encounter (Signed)
This encounter was created in error - please disregard.

## 2016-11-15 NOTE — Telephone Encounter (Signed)
New message   William Krueger is calling to schedule appt for pt. She states that they have called twice since December and have been told to call back to schedule. They were told on Jan. 16th, [name] would call back to schedule and they have not been called. I offered to schedule appt, and pt would need Thursday or Friday, next appt was Thursday April 12. [name] said he would probably not want to wait that long and he wants to speak to his nurse about scheduling.

## 2016-12-10 ENCOUNTER — Encounter: Payer: Self-pay | Admitting: *Deleted

## 2016-12-11 ENCOUNTER — Other Ambulatory Visit: Payer: Self-pay | Admitting: Cardiovascular Disease

## 2016-12-17 ENCOUNTER — Ambulatory Visit (INDEPENDENT_AMBULATORY_CARE_PROVIDER_SITE_OTHER): Payer: Medicare Other | Admitting: Cardiovascular Disease

## 2016-12-17 ENCOUNTER — Encounter: Payer: Self-pay | Admitting: Cardiovascular Disease

## 2016-12-17 VITALS — BP 130/72 | HR 70 | Ht 70.0 in | Wt 183.2 lb

## 2016-12-17 DIAGNOSIS — E785 Hyperlipidemia, unspecified: Secondary | ICD-10-CM

## 2016-12-17 NOTE — Patient Instructions (Signed)

## 2016-12-17 NOTE — Progress Notes (Signed)
Cardiology Office Note Date:  12/17/2016   ID:  William Krueger, DOB 01/24/1946, MRN [id number]  PCP:  Felicie Mornavid Smith, PA  Cardiologist:  Tonny Bollmanooper, Toluwani Ruder, MD    Chief Complaint  Patient presents with  . Follow-up     History of Present Illness: William Krueger is a 71 y.o. male who presents for follow-up evaluation. He's followed for hyperlipidemia and family history of CAD. He is managed with an aggressive medical regimen for his hyperlipidemia with lovaza, niacin, lovastatin, and zetia.  He brings in recent lab work, but his cholesterol panel is not included as it was still pending. He recalls the following lab values: LDL 55 mg/dL, HDL 55 mg/dL, Trig 409110 mg/dL, total cholesterol 811140 mg/dL. BP runs 110/60 on average when he is out of the office setting.   The patient swims for exercise about 3 days per week. He swims a mile each time. He denies exertional symptoms. Specifically denies chest pain, shortness of breath, or heart palpitations. Reports no medication changes. Has no complaints today.  Past Medical History:  Diagnosis Date  . Mixed hyperlipidemia     No past surgical history on file.  Current Outpatient Prescriptions  Medication Sig Dispense Refill  . aspirin 81 MG tablet Take 81 mg by mouth daily.    . Citalopram Hydrobromide (CELEXA PO) Take 30 mg by mouth at bedtime.    . clonazePAM (KLONOPIN) 0.5 MG tablet Take 0.5 mg by mouth at bedtime as needed (sleep).     . divalproex (DEPAKOTE ER) 500 MG 24 hr tablet Take 500 mg by mouth daily.    Marland Kitchen. escitalopram (LEXAPRO) 20 MG tablet Take 20 mg by mouth daily.    Marland Kitchen. ezetimibe (ZETIA) 10 MG tablet Take 1 tablet (10 mg total) by mouth daily. 90 tablet 3  . lovastatin (MEVACOR) 40 MG tablet Take 1 tablet (40 mg total) by mouth at bedtime. 90 tablet 0  . niacin 500 MG tablet Take 1 tablet (500 mg total) by mouth at bedtime. 90 tablet 3  . omega-3 acid ethyl esters (LOVAZA) 1 g capsule Take 1 g by mouth 4 (four) times daily.    .  sildenafil (VIAGRA) 50 MG tablet Take 1 tablet (50 mg total) by mouth daily as needed for erectile dysfunction. 10 tablet 3  . Vitamin D, Ergocalciferol, (DRISDOL) 50000 units CAPS capsule Take 1 capsule by mouth once a week.     No current facility-administered medications for this visit.     Allergies:   Tetracycline   Social History:  The patient  reports that he has never smoked. He has never used smokeless tobacco. He reports that he does not drink alcohol or use drugs.   Family History:  The patient's  family history includes Depression in his mother; Heart disease in his father.    ROS:  Please see the history of present illness.  All other systems are reviewed and negative.    PHYSICAL EXAM: VS:  BP 130/72   Pulse 70   Ht 5\' 10"  (1.778 m)   Wt 183 lb 3.2 oz (83.1 kg)   BMI 26.29 kg/m  , BMI Body mass index is 26.29 kg/m. GEN: Well nourished, well developed, in no acute distress  HEENT: normal  Neck: no JVD, no masses. No carotid bruits Cardiac: RRR without murmur or gallop                Respiratory:  clear to auscultation bilaterally, normal work of breathing GI:  soft, nontender, nondistended, + BS MS: no deformity or atrophy  Ext: no pretibial edema, pedal pulses 2+= bilaterally Skin: warm and dry, no rash Neuro:  Strength and sensation are intact Psych: euthymic mood, full affect  EKG:  EKG is ordered today. The ekg ordered today shows normal sinus rhythm 71 bpm, within normal limits  Recent Labs: No results found for requested labs within last 8760 hours.   Lipid Panel     Component Value Date/Time   CHOL 113 05/06/2009   TRIG 31 05/06/2009   HDL 52 05/06/2009   LDLCALC 55 05/06/2009      Wt Readings from Last 3 Encounters:  12/17/16 183 lb 3.2 oz (83.1 kg)  12/19/15 174 lb (78.9 kg)  10/17/14 175 lb 12.8 oz (79.7 kg)     ASSESSMENT AND PLAN: Hyperlipidemia: Managed with a combination of zetia, lovastatin, and niacin. Lipids reported as above.  Will review the labs when they're available. He will continue on his current medical program. He continues on an exercise regimen as outlined above. I will see him back in one year. As he remains asymptomatic with good exercise tolerance, will not pursue stress testing or screening for subclinical atherosclerosis at this time. All of his other lab work is reviewed today and will be scanned into our system.  Current medicines are reviewed with the patient today.  The patient does not have concerns regarding medicines.  Labs/ tests ordered today include:   Orders Placed This Encounter  Procedures  . EKG 12-Lead    Disposition:   FU one month  Signed, Tonny Bollman, MD  12/17/2016 5:53 PM    [facility] [address] [place], [place], [place]  [address] Phone: [phone]; Fax: [phone]

## 2017-02-09 ENCOUNTER — Other Ambulatory Visit: Payer: Self-pay | Admitting: Cardiovascular Disease

## 2017-02-09 DIAGNOSIS — E785 Hyperlipidemia, unspecified: Secondary | ICD-10-CM

## 2017-03-13 ENCOUNTER — Other Ambulatory Visit: Payer: Self-pay | Admitting: Cardiovascular Disease

## 2017-08-06 ENCOUNTER — Other Ambulatory Visit: Payer: Self-pay | Admitting: Cardiovascular Disease

## 2017-12-15 ENCOUNTER — Other Ambulatory Visit: Payer: Self-pay | Admitting: Cardiovascular Disease

## 2018-01-05 ENCOUNTER — Ambulatory Visit (INDEPENDENT_AMBULATORY_CARE_PROVIDER_SITE_OTHER): Payer: Medicare Other | Admitting: Cardiovascular Disease

## 2018-01-05 ENCOUNTER — Encounter: Payer: Self-pay | Admitting: Cardiovascular Disease

## 2018-01-05 VITALS — BP 128/82 | HR 68 | Ht 70.0 in | Wt 177.0 lb

## 2018-01-05 DIAGNOSIS — E782 Mixed hyperlipidemia: Secondary | ICD-10-CM

## 2018-01-05 NOTE — Progress Notes (Signed)
Cardiology Office Note Date:  01/07/2018   ID:  Krueger, William 07-13-1946, MRN [id number]  PCP:  Allison Quarry, PA  Cardiologist:  Tonny Bollman, MD    Chief Complaint  Patient presents with  . Follow-up    hyperlipidemia     History of Present Illness: William Krueger is a 72 y.o. male who presents for  follow-up evaluation. He's followed for hyperlipidemia and family history of CAD. He is managed with an aggressive medical regimen for his hyperlipidemia with lovaza, niacin, lovastatin, and zetia.  He continues to swim regularly for exercise with no exertional symptoms. Also using weight machines regularly. Today, he denies symptoms of palpitations, chest pain, shortness of breath, orthopnea, PND, lower extremity edema, dizziness, or syncope.  The patient had lab work recently that demonstrated a mild increase in his creatinine of 1.32 mg/dL.  The lab was repeated and no different.  His previous creatinine values were within normal limits with last year's value 1.17.  He does not take any nonsteroidal anti-inflammatory medicines.  He states that he has been well hydrated and drinks plenty of fluids.  He has no other concerns today.   Past Medical History:  Diagnosis Date  . Mixed hyperlipidemia     History reviewed. No pertinent surgical history.  Current Outpatient Medications  Medication Sig Dispense Refill  . aspirin 81 MG tablet Take 81 mg by mouth daily.    . Citalopram Hydrobromide (CELEXA PO) Take 30 mg by mouth at bedtime.    . clonazePAM (KLONOPIN) 0.5 MG tablet Take 0.5 mg by mouth at bedtime as needed (sleep).     . divalproex (DEPAKOTE ER) 500 MG 24 hr tablet Take 500 mg by mouth daily.    Marland Kitchen escitalopram (LEXAPRO) 20 MG tablet Take 20 mg by mouth daily.    Marland Kitchen ezetimibe (ZETIA) 10 MG tablet TAKE 1 TABLET DAILY 90 tablet 3  . lovastatin (MEVACOR) 40 MG tablet Take 1 tablet (40 mg total) by mouth at bedtime. Please keep upcoming appt for future refills. Thank  you 90 tablet 0  . niacin 500 MG tablet Take 1 tablet (500 mg total) by mouth at bedtime. 90 tablet 3  . omega-3 acid ethyl esters (LOVAZA) 1 g capsule TAKE 1 CAPSULE FOUR TIMES A DAY 360 capsule 1  . sildenafil (VIAGRA) 50 MG tablet Take 1 tablet (50 mg total) by mouth daily as needed for erectile dysfunction. 10 tablet 3  . Vitamin D, Ergocalciferol, (DRISDOL) 50000 units CAPS capsule Take 1 capsule by mouth once a week.     No current facility-administered medications for this visit.     Allergies:   Tetracycline   Social History:  The patient  reports that he has never smoked. He has never used smokeless tobacco. He reports that he does not drink alcohol or use drugs.   Family History:  The patient's  family history includes Coronary artery disease in his unknown relative; Depression in his mother; Heart disease in his father; Hyperlipidemia in his unknown relative.    ROS:  Please see the history of present illness.  All other systems are reviewed and negative.    PHYSICAL EXAM: VS:  BP 128/82   Pulse 68   Ht  (1.778 m)   Wt 177 lb (80.3 kg)   SpO2 96%   BMI 25.40 kg/m  , BMI Body mass index is 25.4 kg/m. GEN: Well nourished, well developed, in no acute distress  HEENT: normal  Neck:  no JVD, no masses. No carotid bruits Cardiac: RRR without murmur or gallop      Respiratory:  clear to auscultation bilaterally, normal work of breathing GI: soft, nontender, nondistended, + BS MS: no deformity or atrophy  Ext: no pretibial edema, pedal pulses 2+= bilaterally Skin: warm and dry, no rash Neuro:  Strength and sensation are intact Psych: euthymic mood, full affect  EKG:  EKG is ordered today. The ekg ordered today shows normal sinus rhythm 68 bpm, within normal limits.  Recent Labs: No results found for requested labs within last 8760 hours.   Lipid Panel     Component Value Date/Time   CHOL 113 05/06/2009   TRIG 31 05/06/2009   HDL 52 05/06/2009   LDLCALC 55  05/06/2009      Wt Readings from Last 3 Encounters:  01/05/18 177 lb (80.3 kg)  12/17/16 183 lb 3.2 oz (83.1 kg)  12/19/15 174 lb (78.9 kg)     Cardiac Studies Reviewed: An exercise tolerance test is done November 22, 2014 demonstrating normal exercise tolerance with 9 minutes of exercise according to the Bruce protocol, no chest pain, no significant ST changes.  ASSESSMENT AND PLAN: 1.  Mixed hyperlipidemia: The patient remains on multidrug therapy with ezetimibe, lovastatin, and niacin.  I have reviewed his recent lipids which demonstrate a cholesterol of 145, HDL 47, LDL 76, and triglycerides 161.  He will continue his current medicines without change.  2.  Chronic kidney disease, stage 3: GFR estimated at 53.  Will refer to nephrology.  Current medicines are reviewed with the patient today.  The patient does not have concerns regarding medicines.  Labs/ tests ordered today include:   Orders Placed This Encounter  Procedures  . EKG 12-Lead    Disposition:   FU one year  Signed, Tonny Bollman, MD  01/07/2018 10:21 PM    [facility] [address] [place], [place], [place]  [address] Phone: [phone]; Fax: [phone]

## 2018-01-05 NOTE — Patient Instructions (Signed)
Medication Instructions:  Your provider recommends that you continue on your current medications as directed. Please refer to the Current Medication list given to you today.    Labwork: None  Testing/Procedures: None  Follow-Up: Dr. Lottie RaterJoe Coladonato: [phone]  Your provider wants you to follow-up in: 1 year with Dr. Excell Seltzerooper. You will receive a reminder letter in the mail two months in advance. If you don't receive a letter, please call our office to schedule the follow-up appointment.    Any Other Special Instructions Will Be Listed Below (If Applicable).     If you need a refill on your cardiac medications before your next appointment, please call your pharmacy.

## 2018-01-07 ENCOUNTER — Encounter: Payer: Self-pay | Admitting: Cardiovascular Disease

## 2018-01-09 ENCOUNTER — Telehealth: Payer: Self-pay

## 2018-01-09 DIAGNOSIS — R7989 Other specified abnormal findings of blood chemistry: Secondary | ICD-10-CM

## 2018-01-09 DIAGNOSIS — N183 Chronic kidney disease, stage 3 unspecified: Secondary | ICD-10-CM

## 2018-01-09 NOTE — Telephone Encounter (Signed)
-----   Message from Tonny BollmanMichael Cooper, MD sent at 01/07/2018 10:29 PM EDT ----- Orpha BurKaty: can you refer him to Dr Arrie Aranoladonato for evaluation of CKD 3/elevated creatinine?  thx

## 2018-01-09 NOTE — Telephone Encounter (Signed)
Referral to Dr. Arrie Aranoladonato placed for scheduling.

## 2018-01-18 ENCOUNTER — Other Ambulatory Visit: Payer: Self-pay | Admitting: Cardiovascular Disease

## 2018-01-18 ENCOUNTER — Other Ambulatory Visit: Payer: Self-pay | Admitting: Nephrology

## 2018-01-18 DIAGNOSIS — E785 Hyperlipidemia, unspecified: Secondary | ICD-10-CM

## 2018-01-18 DIAGNOSIS — R7989 Other specified abnormal findings of blood chemistry: Secondary | ICD-10-CM

## 2018-01-19 ENCOUNTER — Ambulatory Visit
Admission: RE | Admit: 2018-01-19 | Discharge: 2018-01-19 | Disposition: A | Discharge status: Home / Self Care | Payer: Medicare Other | Source: Ambulatory Visit | Attending: Nephrology | Admitting: Nephrology

## 2018-01-19 DIAGNOSIS — R7989 Other specified abnormal findings of blood chemistry: Secondary | ICD-10-CM

## 2018-02-07 ENCOUNTER — Other Ambulatory Visit: Payer: Self-pay | Admitting: Nephrology

## 2018-02-07 DIAGNOSIS — N289 Disorder of kidney and ureter, unspecified: Secondary | ICD-10-CM

## 2018-02-16 ENCOUNTER — Ambulatory Visit
Admission: RE | Admit: 2018-02-16 | Discharge: 2018-02-16 | Disposition: A | Discharge status: Home / Self Care | Payer: Medicare Other | Source: Ambulatory Visit | Attending: Nephrology | Admitting: Nephrology

## 2018-02-16 DIAGNOSIS — N289 Disorder of kidney and ureter, unspecified: Secondary | ICD-10-CM

## 2018-02-21 ENCOUNTER — Other Ambulatory Visit: Payer: Self-pay | Admitting: Cardiovascular Disease

## 2018-07-04 ENCOUNTER — Other Ambulatory Visit: Payer: Self-pay | Admitting: Nephrology

## 2018-07-04 DIAGNOSIS — N289 Disorder of kidney and ureter, unspecified: Secondary | ICD-10-CM

## 2018-08-24 ENCOUNTER — Ambulatory Visit
Admission: RE | Admit: 2018-08-24 | Discharge: 2018-08-24 | Disposition: A | Discharge status: Home / Self Care | Payer: Medicare Other | Source: Ambulatory Visit | Attending: Nephrology | Admitting: Nephrology

## 2018-08-24 DIAGNOSIS — N289 Disorder of kidney and ureter, unspecified: Secondary | ICD-10-CM

## 2019-01-13 ENCOUNTER — Other Ambulatory Visit: Payer: Self-pay | Admitting: Cardiovascular Disease

## 2019-01-13 DIAGNOSIS — E785 Hyperlipidemia, unspecified: Secondary | ICD-10-CM

## 2019-02-19 ENCOUNTER — Telehealth: Payer: Self-pay

## 2019-02-19 NOTE — Telephone Encounter (Signed)
Patient has overdue appointment scheduled 6/12 and is on waitlist for sooner appointment. Called to offer e-visit 5/20.   Let message to call back.

## 2019-02-20 NOTE — Telephone Encounter (Signed)
Left message to call back to offer VV with Dr. Excell Seltzer tomorrow at 830AM or 430PM.

## 2019-02-20 NOTE — Telephone Encounter (Signed)
Left message to call back to offer e-visit ([facility]) tomorrow at 1:00PM or 4:30PM.

## 2019-02-20 NOTE — Telephone Encounter (Signed)
Follow up:   patient returning your call back. Please call patient back.

## 2019-02-20 NOTE — Telephone Encounter (Signed)
Appointments filled. Will call patient later if further appointments open.

## 2019-02-20 NOTE — Telephone Encounter (Signed)
Follow up  ° ° °Pt is returning call  ° ° °Please call back  °

## 2019-02-21 NOTE — Telephone Encounter (Signed)
Left message to call back  

## 2019-02-21 NOTE — Telephone Encounter (Signed)
Scheduled e-visit with Dr. Excell Seltzer 5/29. The patient understands to have VS and meds ready for precall. Consent obtained for video visit.      Virtual Visit Pre-Appointment Phone Call  Confirm consent - "In the setting of the current Covid19 crisis, you are scheduled for a (phone or video) visit with your provider on (date) at (time).  Just as we do with many in-office visits, in order for you to participate in this visit, we must obtain consent.  If you'd like, I can send this to your mychart (if signed up) or email for you to review.  Otherwise, I can obtain your verbal consent now.  All virtual visits are billed to your insurance company just like a normal visit would be.  By agreeing to a virtual visit, we'd like you to understand that the technology does not allow for your provider to perform an examination, and thus may limit your provider's ability to fully assess your condition. If your provider identifies any concerns that need to be evaluated in person, we will make arrangements to do so.  Finally, though the technology is pretty good, we cannot assure that it will always work on either your or our end, and in the setting of a video visit, we may have to convert it to a phone-only visit.  In either situation, we cannot ensure that we have a secure connection.  Are you willing to proceed?" STAFF: Did the patient verbally acknowledge consent to telehealth visit? Document YES/NO here: YES   TELEPHONE CALL NOTE  William Krueger has been deemed a candidate for a follow-up tele-health visit to limit community exposure during the Covid-19 pandemic. I spoke with the patient via phone to ensure availability of phone/video source, confirm preferred email & phone number, and discuss instructions and expectations.  I reminded William Krueger to be prepared with any vital sign and/or heart rhythm information that could potentially be obtained via home monitoring, at the time of his visit. I reminded William Krueger to expect a phone call prior to his visit.    IF USING DOXIMITY or [email or web address] - The patient will receive a link just prior to their visit by text.

## 2019-03-02 ENCOUNTER — Telehealth (INDEPENDENT_AMBULATORY_CARE_PROVIDER_SITE_OTHER): Payer: Medicare Other | Admitting: Cardiovascular Disease

## 2019-03-02 ENCOUNTER — Other Ambulatory Visit: Payer: Self-pay | Admitting: Cardiovascular Disease

## 2019-03-02 ENCOUNTER — Encounter: Payer: Self-pay | Admitting: Cardiovascular Disease

## 2019-03-02 ENCOUNTER — Other Ambulatory Visit: Payer: Self-pay

## 2019-03-02 ENCOUNTER — Encounter

## 2019-03-02 VITALS — BP 110/70 | HR 74 | Temp 98.2°F | Ht 70.0 in | Wt 179.0 lb

## 2019-03-02 DIAGNOSIS — N183 Chronic kidney disease, stage 3 (moderate): Secondary | ICD-10-CM

## 2019-03-02 DIAGNOSIS — E782 Mixed hyperlipidemia: Secondary | ICD-10-CM

## 2019-03-02 NOTE — Progress Notes (Signed)
Virtual Visit via Video Note   This visit type was conducted due to national recommendations for restrictions regarding the COVID-19 Pandemic (e.g. social distancing) in an effort to limit this patient's exposure and mitigate transmission in our community.  Due to his co-morbid illnesses, this patient is at least at moderate risk for complications without adequate follow up.  This format is felt to be most appropriate for this patient at this time.  All issues noted in this document were discussed and addressed.  A limited physical exam was performed with this format.  Please refer to the patient's chart for his consent to telehealth for [facility].   Date:  03/02/2019   ID:  William Krueger 1946/06/10, MRN [id number]  Patient Location: Home Provider Location: Home  PCP:  Allison Quarry, PA  Cardiologist:  No primary care provider on file.  Electrophysiologist:  None   Evaluation Performed:  Follow-Up Visit  Chief Complaint:  Follow-up hyperlipidemia  History of Present Illness:    William Krueger is a 73 y.o. male with mixed hyperlipidemia and family history of coronary artery disease.  The patient is managed with a multi drug regimen of Lovaza, niacin, lovastatin, and Zetia.  The patient is evaluated via video conferencing technology in light of the current COVID-19 pandemic.  He remains physically active and is exercising on home equipment about 4 days/week with no exertional symptoms.  He specifically denies chest pain, chest pressure, shortness of breath, leg swelling, or heart palpitations.  He had recent labs drawn with a creatinine of 1.23, cholesterol 128, LDL 70, HDL 42, and triglycerides 80.  Liver function tests were within normal limits.  The patient does not have symptoms concerning for COVID-19 infection (fever, chills, cough, or new shortness of breath).    Past Medical History:  Diagnosis Date  . Mixed hyperlipidemia    No past surgical history on file.   Current Meds  Medication Sig  . aspirin 81 MG tablet Take 81 mg by mouth daily.  . clonazePAM (KLONOPIN) 0.5 MG tablet Take 0.5 mg by mouth at bedtime as needed (sleep).   . divalproex (DEPAKOTE ER) 500 MG 24 hr tablet Take 500 mg by mouth daily.  Marland Kitchen ezetimibe (ZETIA) 10 MG tablet TAKE 1 TABLET DAILY  . lovastatin (MEVACOR) 40 MG tablet Take 1 tablet (40 mg total) by mouth at bedtime.  . niacin 500 MG tablet Take 1 tablet (500 mg total) by mouth at bedtime.  Marland Kitchen omega-3 acid ethyl esters (LOVAZA) 1 g capsule TAKE 1 CAPSULE FOUR TIMES A DAY  . sildenafil (VIAGRA) 50 MG tablet Take 1 tablet (50 mg total) by mouth daily as needed for erectile dysfunction.  . Vitamin D, Ergocalciferol, (DRISDOL) 50000 units CAPS capsule Take 1 capsule by mouth once a week.     Allergies:   Tetracycline   Social History   Tobacco Use  . Smoking status: Never Smoker  . Smokeless tobacco: Never Used  Substance Use Topics  . Alcohol use: No  . Drug use: No     Family Hx: The patient's family history includes Coronary artery disease in his unknown relative; Depression in his mother; Heart disease in his father; Hyperlipidemia in his unknown relative.  ROS:   Please see the history of present illness.    All other systems reviewed and are negative.  Labs/Other Tests and Data Reviewed:    EKG:  An ECG dated 01/05/2018 was personally reviewed today and demonstrated:  NSR 68  bpm, within normal limits  Recent Labs: No results found for requested labs within last 8760 hours.   Recent Lipid Panel Lab Results  Component Value Date/Time   CHOL 113 05/06/2009   TRIG 31 05/06/2009   HDL 52 05/06/2009   LDLCALC 55 05/06/2009    Wt Readings from Last 3 Encounters:  03/02/19 179 lb (81.2 kg)  01/05/18 177 lb (80.3 kg)  12/17/16 183 lb 3.2 oz (83.1 kg)     Objective:    Vital Signs:  BP 110/70   Pulse 74   Temp 98.2 F (36.8 C)   Ht 5\' 10"  (1.778 m)   Wt 179 lb (81.2 kg)   BMI 25.68 kg/m    VITAL  SIGNS:  reviewed Alert, oriented, NAD.  Remaining exam not performed secondary to telehealth visit type  ASSESSMENT & PLAN:    1. Mixed hyperlipidemia: lipids reviewed as outlined above. At goal on current Rx. No changes recommended. LFT's normal. Continue diet/exercise regimen. 2.   Chronic kidney disease, stage III: recent creatinine stable at 1.23 mg/dL. Followed by Dr Arrie Aranoladonato.   COVID-19 Education: The signs and symptoms of COVID-19 were discussed with the patient and how to seek care for testing (follow up with PCP or arrange E-visit).  The importance of social distancing was discussed today.  Time:   Today, I have spent 24 minutes with the patient with telehealth technology discussing the above problems.     Medication Adjustments/Labs and Tests Ordered: Current medicines are reviewed at length with the patient today.  Concerns regarding medicines are outlined above.   Tests Ordered: No orders of the defined types were placed in this encounter.   Medication Changes: No orders of the defined types were placed in this encounter.   Disposition:  Follow up in 1 year(s)  Signed, Tonny BollmanMichael Cristina Mattern, MD  03/02/2019 8:04 AM    [facility]

## 2019-03-16 ENCOUNTER — Ambulatory Visit: Payer: Medicare Other | Admitting: Cardiovascular Disease

## 2019-03-27 ENCOUNTER — Other Ambulatory Visit: Payer: Self-pay

## 2019-03-27 ENCOUNTER — Encounter: Payer: Self-pay | Admitting: Cardiovascular Disease

## 2019-03-27 DIAGNOSIS — R4020 Unspecified coma: Secondary | ICD-10-CM

## 2019-03-27 DIAGNOSIS — R55 Syncope and collapse: Secondary | ICD-10-CM
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
## 2019-03-27 NOTE — Progress Notes (Signed)
Attempted to call patient. Orders placed for echo and event monitor.   Left message to call back to arrange echo.

## 2019-03-28 ENCOUNTER — Telehealth: Payer: Self-pay
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
## 2019-04-05 ENCOUNTER — Other Ambulatory Visit (HOSPITAL_COMMUNITY): Payer: Medicare Other

## 2019-04-13 ENCOUNTER — Other Ambulatory Visit: Payer: Self-pay | Admitting: Cardiovascular Disease

## 2019-04-13 DIAGNOSIS — E785 Hyperlipidemia, unspecified: Secondary | ICD-10-CM

## 2019-04-23 ENCOUNTER — Telehealth: Payer: Self-pay
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
## 2019-04-25 NOTE — Telephone Encounter (Signed)
Paperwork received. Will review with Dr. Burt Knack this afternoon.

## 2019-04-27 NOTE — Telephone Encounter (Signed)
Paperwork filled out by Dr. Burt Knack and faxed to patient.

## 2019-05-30 ENCOUNTER — Encounter: Payer: Self-pay | Admitting: Internal Medicine

## 2019-07-11 ENCOUNTER — Encounter: Payer: Self-pay | Admitting: Gastroenterology

## 2019-07-13 ENCOUNTER — Other Ambulatory Visit: Payer: Self-pay | Admitting: *Deleted

## 2019-07-13 ENCOUNTER — Encounter: Payer: Self-pay | Admitting: Gastroenterology

## 2019-07-13 ENCOUNTER — Encounter: Payer: Self-pay | Admitting: *Deleted

## 2019-07-13 ENCOUNTER — Other Ambulatory Visit: Payer: Self-pay

## 2019-07-13 ENCOUNTER — Ambulatory Visit (INDEPENDENT_AMBULATORY_CARE_PROVIDER_SITE_OTHER): Payer: Medicare Other | Admitting: Gastroenterology

## 2019-07-13 DIAGNOSIS — Z79899 Other long term (current) drug therapy: Secondary | ICD-10-CM | POA: Diagnosis not present

## 2019-07-13 DIAGNOSIS — Z1211 Encounter for screening for malignant neoplasm of colon: Secondary | ICD-10-CM

## 2019-07-13 MED ORDER — NA SULFATE-K SULFATE-MG SULF 17.5-3.13-1.6 GM/177ML PO SOLN: 1.0000 | Freq: Once | ORAL | 0 refills | Status: AC

## 2019-07-13 NOTE — Progress Notes (Signed)
Primary Care Physician:  Jilda Roche, [place]  Primary Gastroenterologist:  Garfield Cornea, [place]   Chief Complaint  Patient presents with  . Colonoscopy    due for 10 year TCS    HPI:  William Krueger is a 73 y.o. male, practicing pulmonologist, here to schedule 10-year screening colonoscopy.  His last colonoscopy was in December 2010 by Dr. Lucio Edward.  He had mild sigmoid colon diverticulosis, internal hemorrhoids.  Advised repeat colonoscopy in 10 years.  Patient had a maternal aunt who had colon cancer at age greater than 71.  No personal history of colon polyps.  He denies any constipation, diarrhea, melena, rectal bleeding.  No abdominal pain.  No upper GI symptoms.  No unintentional weight loss.  He remains very active, swims several times per week.  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  Current Outpatient Medications  Medication Sig Dispense Refill  . aspirin 81 MG tablet Take 81 mg by mouth daily.    . clonazePAM (KLONOPIN) 0.5 MG tablet Take 0.5 mg by mouth at bedtime as needed (sleep).     . divalproex (DEPAKOTE ER) 500 MG 24 hr tablet Take 500 mg by mouth daily.    Marland Kitchen ezetimibe (ZETIA) 10 MG tablet TAKE 1 TABLET DAILY 90 tablet 3  . LEXAPRO 20 MG tablet Take 1 tablet by mouth at bedtime.    . lovastatin (MEVACOR) 40 MG tablet TAKE 1 TABLET AT BEDTIME 90 tablet 3  . niacin 500 MG tablet Take 1 tablet (500 mg total) by mouth at bedtime. 90 tablet 3  . omega-3 acid ethyl esters (LOVAZA) 1 g capsule TAKE 1 CAPSULE FOUR TIMES A DAY 360 capsule 3  . UNABLE TO FIND once a week. Med Name: Allergy Shot    . Vitamin D, Ergocalciferol, (DRISDOL) 50000 units CAPS capsule Take 1 capsule by mouth once a week.    . Na Sulfate-K Sulfate-Mg Sulf 17.5-3.13-1.6 GM/177ML SOLN Take 1 kit by mouth once for 1 dose. 354 mL 0   No current facility-administered medications for this visit.     Allergies as of 07/13/2019 - Review Complete 07/13/2019   Allergen Reaction Noted  . Tetracycline Rash and Hives 08/25/2009    Past Medical History:  Diagnosis Date  . Bipolar disorder ([place])   . Mixed hyperlipidemia     Past Surgical History:  Procedure Laterality Date  . APPENDECTOMY     age 31  . INGUINAL HERNIA REPAIR Bilateral     Family History  Problem Relation Age of Onset  . Depression Mother   . Heart disease Father   . Coronary artery disease Other        family history  . Hyperlipidemia Other        family history  . Colon cancer Maternal Aunt        Diagnosed at age greater than 70.      Social History   Socioeconomic History  . Marital status: Married    Spouse name: Not on file  . Number of children: Not on file  . Years of education: Not on file  . Highest education level: Not on file  Occupational History  . Not on file  Social Needs  . Financial resource strain: Not on file  . Food insecurity    Worry: Not on file    Inability: Not on file  . Transportation needs    Medical: Not on file    Non-medical: Not  on file  Tobacco Use  . Smoking status: Never Smoker  . Smokeless tobacco: Never Used  Substance and Sexual Activity  . Alcohol use: No    Comment: Prior use but quit 2 years ago  . Drug use: No  . Sexual activity: Not on file  Lifestyle  . Physical activity    Days per week: Not on file    Minutes per session: Not on file  . Stress: Not on file  Relationships  . Social Herbalist on phone: Not on file    Gets together: Not on file    Attends religious service: Not on file    Active member of club or organization: Not on file    Attends meetings of clubs or organizations: Not on file    Relationship status: Not on file  . Intimate partner violence    Fear of current or ex partner: Not on file    Emotionally abused: Not on file    Physically abused: Not on file    Forced sexual activity: Not on file  Other Topics Concern  . Not on file  Social History Narrative  . Not  on file      ROS:  General: Negative for anorexia, weight loss, fever, chills, fatigue, weakness. Eyes: Negative for vision changes.  ENT: Negative for hoarseness, difficulty swallowing , nasal congestion. CV: Negative for chest pain, angina, palpitations, dyspnea on exertion, peripheral edema.  Respiratory: Negative for dyspnea at rest, dyspnea on exertion, cough, sputum, wheezing.  GI: See history of present illness. GU:  Negative for dysuria, hematuria, urinary incontinence, urinary frequency, nocturnal urination.  MS: Negative for joint pain, low back pain.  Derm: Negative for rash or itching.  Neuro: Negative for weakness, abnormal sensation, seizure, frequent headaches, memory loss, confusion.  Psych: Negative for anxiety, depression, suicidal ideation, hallucinations.  History of bipolar disorder, well managed. Endo: Negative for unusual weight change.  Heme: Negative for bruising or bleeding. Allergy: Negative for rash or hives.    Physical Examination:  BP 122/77   Pulse 61   Temp (!) 97.1 F (36.2 C) (Temporal)   Ht '5\' 10"'  (1.778 m)   Wt 180 lb 9.6 oz (81.9 kg)   BMI 25.91 kg/m    General: Well-nourished, well-developed in no acute distress.  Head: Normocephalic, atraumatic.   Eyes: Conjunctiva pink, no icterus. Mouth: Oropharyngeal mucosa moist and pink , no lesions erythema or exudate. Neck: Supple without thyromegaly, masses, or lymphadenopathy.  Lungs: Clear to auscultation bilaterally.  Heart: Regular rate and rhythm, no murmurs rubs or gallops.  Abdomen: Bowel sounds are normal, nontender, nondistended, no hepatosplenomegaly or masses, no abdominal bruits or    hernia , no rebound or guarding.   Rectal: Not performed Extremities: No lower extremity edema. No clubbing or deformities.  Neuro: Alert and oriented x 4 , grossly normal neurologically.  Skin: Warm and dry, no rash or jaundice.   Psych: Alert and cooperative, normal mood and affect.  Labs: May  2020: BUN 9, creatinine 1.23, GFR greater than 60, hemoglobin 14.8, hematocrit 45.4, platelets 205,000   Imaging Studies: No results found.

## 2019-07-13 NOTE — Assessment & Plan Note (Signed)
Very pleasant 73 year old male presenting to schedule 10-year screening colonoscopy.  Denies any GI symptoms.  Paternal aunt diagnosed with colon cancer at age greater than 1.  Patient desires average risk screening colonoscopy.  Given polypharmacy, patient preference, will proceed with deep sedation with propofol.  I have discussed the risks, alternatives, benefits with regards to but not limited to the risk of reaction to medication, bleeding, infection, perforation and the patient is agreeable to proceed. Written consent to be obtained.

## 2019-07-13 NOTE — Patient Instructions (Signed)
1. Colonoscopy as scheduled. See separate instructions.  

## 2019-07-16 ENCOUNTER — Encounter: Payer: Self-pay | Admitting: *Deleted

## 2019-08-07 ENCOUNTER — Other Ambulatory Visit: Payer: Self-pay | Admitting: Nephrology

## 2019-08-07 DIAGNOSIS — N289 Disorder of kidney and ureter, unspecified: Secondary | ICD-10-CM

## 2019-09-03 ENCOUNTER — Encounter: Payer: Self-pay | Admitting: Gastroenterology

## 2019-09-06 ENCOUNTER — Ambulatory Visit
Admission: RE | Admit: 2019-09-06 | Discharge: 2019-09-06 | Disposition: A | Discharge status: Home / Self Care | Payer: Medicare Other | Source: Ambulatory Visit | Attending: Nephrology | Admitting: Nephrology

## 2019-09-06 DIAGNOSIS — N289 Disorder of kidney and ureter, unspecified: Secondary | ICD-10-CM

## 2019-10-16 ENCOUNTER — Telehealth: Payer: Self-pay

## 2019-10-16 NOTE — Telephone Encounter (Signed)
Opened in error

## 2019-10-19 ENCOUNTER — Encounter (HOSPITAL_COMMUNITY)
Admission: RE | Admit: 2019-10-19 | Discharge: 2019-10-19 | Disposition: A | Discharge status: Home / Self Care | Payer: Medicare Other | Source: Ambulatory Visit | Attending: Internal Medicine | Admitting: Internal Medicine

## 2019-10-19 ENCOUNTER — Encounter (HOSPITAL_COMMUNITY): Payer: Self-pay

## 2019-10-19 ENCOUNTER — Other Ambulatory Visit (HOSPITAL_COMMUNITY)
Admission: RE | Admit: 2019-10-19 | Discharge: 2019-10-19 | Disposition: A | Discharge status: Home / Self Care | Payer: Medicare Other | Source: Ambulatory Visit | Attending: Internal Medicine | Admitting: Internal Medicine

## 2019-10-19 ENCOUNTER — Telehealth: Payer: Self-pay | Admitting: Internal Medicine

## 2019-10-19 ENCOUNTER — Other Ambulatory Visit: Payer: Self-pay

## 2019-10-19 DIAGNOSIS — Z01812 Encounter for preprocedural laboratory examination: Secondary | ICD-10-CM | POA: Diagnosis present

## 2019-10-19 DIAGNOSIS — Z20822 Contact with and (suspected) exposure to covid-19: Secondary | ICD-10-CM | POA: Diagnosis not present

## 2019-10-19 LAB — SARS CORONAVIRUS 2 (TAT 6-24 HRS): SARS Coronavirus 2: NEGATIVE

## 2019-10-19 NOTE — Telephone Encounter (Signed)
Pt (Dr Tawanna Sat) would like for RMR to call him on his cell [phone]. He is scheduled for a procedure for Monday 10/22/2019.

## 2019-10-19 NOTE — Telephone Encounter (Signed)
Pt received his second covid-19 vaccine on Wednesday 10/17/2019. Pt's covid -19 test is scheduled for today 10/19/2019 and pt states that if the vaccine works, his test should be positive. Pt  wants RMR to beware that he may receive positiveresults from pts, if they've taken their covid-19 vaccine around the time that they test for covid -19 for a procedure.

## 2019-10-20 NOTE — Telephone Encounter (Signed)
I called patient back.  Reviewed plans for screening colonoscopy on the 18th.  He is Covid negative as of yesterday.

## 2019-10-22 ENCOUNTER — Ambulatory Visit (HOSPITAL_COMMUNITY): Payer: Medicare Other | Admitting: Anesthesiology

## 2019-10-22 ENCOUNTER — Encounter (HOSPITAL_COMMUNITY): Payer: Self-pay | Admitting: Internal Medicine

## 2019-10-22 ENCOUNTER — Encounter (HOSPITAL_COMMUNITY)
Admission: RE | Disposition: A | Discharge status: Home / Self Care | Payer: Self-pay | Source: Home / Self Care | Attending: Internal Medicine

## 2019-10-22 ENCOUNTER — Ambulatory Visit (HOSPITAL_COMMUNITY)
Admission: RE | Admit: 2019-10-22 | Discharge: 2019-10-22 | Disposition: A | Discharge status: Home / Self Care | Payer: Medicare Other | Attending: Internal Medicine | Admitting: Internal Medicine

## 2019-10-22 DIAGNOSIS — Z800 Family history of malignant neoplasm of digestive organs: Secondary | ICD-10-CM | POA: Insufficient documentation

## 2019-10-22 DIAGNOSIS — E782 Mixed hyperlipidemia: Secondary | ICD-10-CM | POA: Insufficient documentation

## 2019-10-22 DIAGNOSIS — K5730 Diverticulosis of large intestine without perforation or abscess without bleeding: Secondary | ICD-10-CM | POA: Insufficient documentation

## 2019-10-22 DIAGNOSIS — Z1211 Encounter for screening for malignant neoplasm of colon: Secondary | ICD-10-CM | POA: Diagnosis present

## 2019-10-22 DIAGNOSIS — Z818 Family history of other mental and behavioral disorders: Secondary | ICD-10-CM | POA: Insufficient documentation

## 2019-10-22 DIAGNOSIS — Z8249 Family history of ischemic heart disease and other diseases of the circulatory system: Secondary | ICD-10-CM | POA: Diagnosis not present

## 2019-10-22 DIAGNOSIS — Z7982 Long term (current) use of aspirin: Secondary | ICD-10-CM | POA: Insufficient documentation

## 2019-10-22 DIAGNOSIS — Z79899 Other long term (current) drug therapy: Secondary | ICD-10-CM | POA: Diagnosis not present

## 2019-10-22 DIAGNOSIS — K640 First degree hemorrhoids: Secondary | ICD-10-CM | POA: Insufficient documentation

## 2019-10-22 DIAGNOSIS — F319 Bipolar disorder, unspecified: Secondary | ICD-10-CM | POA: Insufficient documentation

## 2019-10-22 DIAGNOSIS — Z8349 Family history of other endocrine, nutritional and metabolic diseases: Secondary | ICD-10-CM | POA: Insufficient documentation

## 2019-10-22 DIAGNOSIS — Z881 Allergy status to other antibiotic agents status: Secondary | ICD-10-CM | POA: Diagnosis not present

## 2019-10-22 HISTORY — PX: COLONOSCOPY WITH PROPOFOL: SHX5780

## 2019-10-22 SURGERY — COLONOSCOPY WITH PROPOFOL
Anesthesia: General

## 2019-10-22 MED ORDER — LACTATED RINGERS IV SOLN: Freq: Once | INTRAVENOUS | Status: AC

## 2019-10-22 MED ORDER — KETAMINE HCL 10 MG/ML IJ SOLN
INTRAMUSCULAR | Status: DC | PRN
  Administered 2019-10-22: 20 mg via INTRAVENOUS

## 2019-10-22 MED ORDER — GLYCOPYRROLATE PF 0.2 MG/ML IJ SOSY
PREFILLED_SYRINGE | INTRAMUSCULAR | Status: AC
  Filled 2019-10-22: qty 1

## 2019-10-22 MED ORDER — CHLORHEXIDINE GLUCONATE CLOTH 2 % EX PADS: 6.0000 | MEDICATED_PAD | Freq: Once | CUTANEOUS | Status: DC

## 2019-10-22 MED ORDER — GLYCOPYRROLATE 0.2 MG/ML IJ SOLN
INTRAMUSCULAR | Status: DC | PRN
  Administered 2019-10-22: .2 mg via INTRAVENOUS

## 2019-10-22 MED ORDER — KETAMINE HCL 50 MG/5ML IJ SOSY
PREFILLED_SYRINGE | INTRAMUSCULAR | Status: AC
  Filled 2019-10-22: qty 5

## 2019-10-22 MED ORDER — LIDOCAINE HCL (CARDIAC) PF 100 MG/5ML IV SOSY
PREFILLED_SYRINGE | INTRAVENOUS | Status: DC | PRN
  Administered 2019-10-22: 80 mg via INTRAVENOUS

## 2019-10-22 MED ORDER — STERILE WATER FOR IRRIGATION IR SOLN
Status: DC | PRN
  Administered 2019-10-22: 1.5 mL

## 2019-10-22 MED ORDER — PROPOFOL 500 MG/50ML IV EMUL
INTRAVENOUS | Status: DC | PRN
  Administered 2019-10-22: 175 ug/kg/min via INTRAVENOUS

## 2019-10-22 MED ORDER — PROPOFOL 10 MG/ML IV BOLUS
INTRAVENOUS | Status: DC | PRN
  Administered 2019-10-22: 20 mg via INTRAVENOUS

## 2019-10-22 MED ORDER — LIDOCAINE 2% (20 MG/ML) 5 ML SYRINGE
INTRAMUSCULAR | Status: AC
  Filled 2019-10-22: qty 5

## 2019-10-22 MED ORDER — PROPOFOL 10 MG/ML IV BOLUS
INTRAVENOUS | Status: AC
  Filled 2019-10-22: qty 80

## 2019-10-22 NOTE — H&P (Signed)
@LOGO @   Primary Care Physician:  Jilda Roche, PA Primary Gastroenterologist:  Dr. Gala Romney  Pre-Procedure History & Physical: HPI:  William Krueger is a 74 y.o. male is here for a screening colonoscopy. . Negative colonoscopy 10 years ago (diverticulosis).  No family history of any first-degree relatives with colon cancer.  No bowel symptoms.  Past Medical History:  Diagnosis Date  . Bipolar disorder ([place])   . Mixed hyperlipidemia     Past Surgical History:  Procedure Laterality Date  . APPENDECTOMY     age 54  . INGUINAL HERNIA REPAIR Bilateral     Prior to Admission medications   Medication Sig Start Date End Date Taking? Authorizing Provider  aspirin EC 81 MG tablet Take 81 mg by mouth daily.   Yes [provider]  clonazePAM (KLONOPIN) 0.5 MG tablet Take 0.5 mg by mouth at bedtime.    Yes [provider]  divalproex (DEPAKOTE ER) 500 MG 24 hr tablet Take 500 mg by mouth at bedtime.    Yes [provider]  ezetimibe (ZETIA) 10 MG tablet TAKE 1 TABLET DAILY Patient taking differently: Take 10 mg by mouth daily.  04/13/19  Yes Sherren Mocha, MD  LEXAPRO 20 MG tablet Take 20 mg by mouth at bedtime.  05/16/19  Yes [provider]  lovastatin (MEVACOR) 40 MG tablet TAKE 1 TABLET AT BEDTIME Patient taking differently: Take 40 mg by mouth at bedtime.  03/02/19  Yes Sherren Mocha, MD  niacin 500 MG tablet Take 1 tablet (500 mg total) by mouth at bedtime. 05/21/15  Yes Sherren Mocha, MD  omega-3 acid ethyl esters (LOVAZA) 1 g capsule TAKE 1 CAPSULE FOUR TIMES A DAY Patient taking differently: Take 2 g by mouth daily.  04/13/19  Yes Sherren Mocha, MD  Vitamin D, Ergocalciferol, (DRISDOL) 50000 units CAPS capsule Take 50,000 Units by mouth every Monday.  11/05/15  Yes [provider]  UNABLE TO FIND once a week. Med Name: Allergy Shot    [provider]    Allergies as of 07/13/2019 - Review Complete 07/13/2019  Allergen  Reaction Noted  . Tetracycline Rash and Hives 08/25/2009    Family History  Problem Relation Age of Onset  . Depression Mother   . Heart disease Father   . Coronary artery disease Other        family history  . Hyperlipidemia Other        family history  . Colon cancer Maternal Aunt        Diagnosed at age greater than 28.      Social History   Socioeconomic History  . Marital status: Married    Spouse name: Not on file  . Number of children: Not on file  . Years of education: Not on file  . Highest education level: Not on file  Occupational History  . Not on file  Tobacco Use  . Smoking status: Never Smoker  . Smokeless tobacco: Never Used  Substance and Sexual Activity  . Alcohol use: No    Comment: Prior use but quit 2 years ago  . Drug use: No  . Sexual activity: Not on file  Other Topics Concern  . Not on file  Social History Narrative  . Not on file   Social Determinants of Health   Financial Resource Strain:   . Difficulty of Paying Living Expenses: Not on file  Food Insecurity:   . Worried About Charity fundraiser in the Last Year:  Not on file  . Ran Out of Food in the Last Year: Not on file  Transportation Needs:   . Lack of Transportation (Medical): Not on file  . Lack of Transportation (Non-Medical): Not on file  Physical Activity:   . Days of Exercise per Week: Not on file  . Minutes of Exercise per Session: Not on file  Stress:   . Feeling of Stress : Not on file  Social Connections:   . Frequency of Communication with Friends and Family: Not on file  . Frequency of Social Gatherings with Friends and Family: Not on file  . Attends Religious Services: Not on file  . Active Member of Clubs or Organizations: Not on file  . Attends Banker Meetings: Not on file  . Marital Status: Not on file  Intimate Partner Violence:   . Fear of Current or Ex-Partner: Not on file  . Emotionally Abused: Not on file  . Physically Abused: Not on  file  . Sexually Abused: Not on file    Review of Systems: See HPI, otherwise negative ROS  Physical Exam: BP 136/73   Pulse 70   Resp 20   SpO2 96%  General:   Alert,  Well-developed, well-nourished, pleasant and cooperative in NAD ition normal. Neck:  Supple; no masses or thyromegaly. Lungs:  Clear throughout to auscultation.   No wheezes, crackles, or rhonchi. No acute distress. Heart:  Regular rate and rhythm; no murmurs, clicks, rubs,  or gallops. Abdomen:  Soft, nontender and nondistended. No masses, hepatosplenomegaly or hernias noted. Normal bowel sounds, without guarding, and without rebound.    Impression/Plan: RENEL ENDE is now here to undergo a screening colonoscopy.  Average risk rating examination  Risks, benefits, limitations, imponderables and alternatives regarding colonoscopy have been reviewed with the patient. Questions have been answered. All parties agreeable.     Notice:  This dictation was prepared with Dragon dictation along with smaller phrase technology. Any transcriptional errors that result from this process are unintentional and may not be corrected upon review.

## 2019-10-22 NOTE — Telephone Encounter (Signed)
Noted  

## 2019-10-22 NOTE — Anesthesia Procedure Notes (Signed)
Procedure Name: General with mask airway Date/Time: 10/22/2019 7:34 AM Performed by: Pernell Dupre, Jalie Eiland A, CRNA Pre-anesthesia Checklist: Timeout performed, Patient being monitored, Suction available, Emergency Drugs available and Patient identified Patient Re-evaluated:Patient Re-evaluated prior to induction Oxygen Delivery Method: Non-rebreather mask

## 2019-10-22 NOTE — Discharge Instructions (Signed)
  Colonoscopy Discharge Instructions  Read the instructions outlined below and refer to this sheet in the next few weeks. These discharge instructions provide you with general information on caring for yourself after you leave the hospital. Your doctor may also give you specific instructions. While your treatment has been planned according to the most current medical practices available, unavoidable complications occasionally occur. If you have any problems or questions after discharge, call Dr. Jena Gauss at [phone]. ACTIVITY  You may resume your regular activity, but move at a slower pace for the next 24 hours.   Take frequent rest periods for the next 24 hours.   Walking will help get rid of the air and reduce the bloated feeling in your belly (abdomen).   No driving for 24 hours (because of the medicine (anesthesia) used during the test).    Do not sign any important legal documents or operate any machinery for 24 hours (because of the anesthesia used during the test).  NUTRITION  Drink plenty of fluids.   You may resume your normal diet as instructed by your doctor.   Begin with a light meal and progress to your normal diet. Heavy or fried foods are harder to digest and may make you feel sick to your stomach (nauseated).   Avoid alcoholic beverages for 24 hours or as instructed.  MEDICATIONS  You may resume your normal medications unless your doctor tells you otherwise.  WHAT YOU CAN EXPECT TODAY  Some feelings of bloating in the abdomen.   Passage of more gas than usual.   Spotting of blood in your stool or on the toilet paper.  IF YOU HAD POLYPS REMOVED DURING THE COLONOSCOPY:  No aspirin products for 7 days or as instructed.   No alcohol for 7 days or as instructed.   Eat a soft diet for the next 24 hours.  FINDING OUT THE RESULTS OF YOUR TEST Not all test results are available during your visit. If your test results are not back during the visit, make an appointment  with your caregiver to find out the results. Do not assume everything is normal if you have not heard from your caregiver or the medical facility. It is important for you to follow up on all of your test results.  SEEK IMMEDIATE MEDICAL ATTENTION IF:  You have more than a spotting of blood in your stool.   Your belly is swollen (abdominal distention).   You are nauseated or vomiting.   You have a temperature over 101.   You have abdominal pain or discomfort that is severe or gets worse throughout the day.   Diverticulosis information provided  I do not recommend a future colonoscopy unless you develop new symptoms.  I recommend a heart healthy (high-fiber low-fat diet).  If anything comes up in the future, do not hesitate to give me a call (cell [phone])  I called wife, Erskine Squibb, at [phone] and reviewed results

## 2019-10-22 NOTE — Anesthesia Postprocedure Evaluation (Signed)
Anesthesia Post Note  Patient: William Krueger  Procedure(s) Performed: COLONOSCOPY WITH PROPOFOL (N/A )  Patient location during evaluation: PACU Anesthesia Type: General Level of consciousness: awake and alert and oriented Pain management: pain level controlled Vital Signs Assessment: post-procedure vital signs reviewed and stable Respiratory status: spontaneous breathing Cardiovascular status: stable Postop Assessment: no apparent nausea or vomiting Anesthetic complications: no     Last Vitals:  Vitals:   10/22/19 0644  BP: 136/73  Pulse: 70  Resp: 20  SpO2: 96%    Last Pain:  Vitals:   10/22/19 0738  PainSc: 0-No pain                 Wyndham Santilli A

## 2019-10-22 NOTE — Transfer of Care (Signed)
Immediate Anesthesia Transfer of Care Note  Patient: William Krueger  Procedure(s) Performed: COLONOSCOPY WITH PROPOFOL (N/A )  Patient Location: PACU  Anesthesia Type:General  Level of Consciousness: awake, oriented and patient cooperative  Airway & Oxygen Therapy: Patient Spontanous Breathing and Patient connected to face mask oxygen  Post-op Assessment: Report given to RN and Post -op Vital signs reviewed and stable  Post vital signs: Reviewed and stable  Last Vitals:  Vitals Value Taken Time  BP    Temp    Pulse    Resp 16 10/22/19 0809  SpO2    Vitals shown include unvalidated device data.  Last Pain:  Vitals:   10/22/19 0738  PainSc: 0-No pain      Patients Stated Pain Goal: 3 (10/22/19 7741)  Complications: No apparent anesthesia complications

## 2019-10-22 NOTE — Anesthesia Preprocedure Evaluation (Signed)
Anesthesia Evaluation  Patient identified by MRN, date of birth, ID band Patient awake    Reviewed: Allergy & Precautions, NPO status , Patient's Chart, lab work & pertinent test results  Airway Mallampati: II   Neck ROM: Full    Dental  (+) Caps, Dental Advisory Given   Pulmonary neg pulmonary ROS,    Pulmonary exam normal breath sounds clear to auscultation       Cardiovascular Exercise Tolerance: Good negative cardio ROS Normal cardiovascular exam Rhythm:Regular Rate:Normal     Neuro/Psych PSYCHIATRIC DISORDERS Bipolar Disorder    GI/Hepatic negative GI ROS, Neg liver ROS,   Endo/Other  negative endocrine ROS  Renal/GU negative Renal ROS     Musculoskeletal   Abdominal   Peds negative pediatric ROS (+)  Hematology negative hematology ROS (+)   Anesthesia Other Findings   Reproductive/Obstetrics                             Anesthesia Physical Anesthesia Plan  ASA: II  Anesthesia Plan: General   Post-op Pain Management:    Induction: Intravenous  PONV Risk Score and Plan: TIVA  Airway Management Planned: Nasal Cannula and Natural Airway  Additional Equipment:   Intra-op Plan:   Post-operative Plan:   Informed Consent: I have reviewed the patients History and Physical, chart, labs and discussed the procedure including the risks, benefits and alternatives for the proposed anesthesia with the patient or authorized representative who has indicated his/her understanding and acceptance.     Dental advisory given  Plan Discussed with: CRNA  Anesthesia Plan Comments:         Anesthesia Quick Evaluation

## 2019-10-22 NOTE — Op Note (Signed)
[facility] Patient Name: William Krueger Procedure Date: 10/22/2019 7:12 AM MRN: [id number] Date of Birth: 1946-06-09 Attending MD: Gennette Pac , MD CSN: [id number] Age: 74 Admit Type: Outpatient Procedure:                Colonoscopy Indications:              Screening for colorectal malignant neoplasm Providers:                Gennette Pac, MD, Buel Ream. Thomasena Edis RN, RN,                            Dyann Ruddle Referring MD:              Medicines:                Propofol per Anesthesia Complications:            No immediate complications. Estimated Blood Loss:     Estimated blood loss: none. Procedure:                Pre-Anesthesia Assessment:                           - Prior to the procedure, a History and Physical                            was performed, and patient medications and                            allergies were reviewed. The patient's tolerance of                            previous anesthesia was also reviewed. The risks                            and benefits of the procedure and the sedation                            options and risks were discussed with the patient.                            All questions were answered, and informed consent                            was obtained. Prior Anticoagulants: The patient has                            taken no previous anticoagulant or antiplatelet                            agents. ASA Grade Assessment: II - A patient with                            mild systemic disease. After reviewing the risks  and benefits, the patient was deemed in                            satisfactory condition to undergo the procedure.                           After obtaining informed consent, the colonoscope                            was passed under direct vision. Throughout the                            procedure, the patient's blood pressure, pulse, and                            oxygen  saturations were monitored continuously. The                            CF-[id number] ([id number]) scope was introduced through                            the anus and advanced to the the cecum, identified                            by appendiceal orifice and ileocecal valve. The                            colonoscopy was performed without difficulty. The                            patient tolerated the procedure well. The quality                            of the bowel preparation was adequate. Scope In: 7:42:35 AM Scope Out: 7:59:47 AM Scope Withdrawal Time: 0 hours 12 minutes 7 seconds  Total Procedure Duration: 0 hours 17 minutes 12 seconds  Findings:      The perianal and digital rectal examinations were normal.      Scattered medium-mouthed diverticula were found in the sigmoid colon and       descending colon.      Non-bleeding internal hemorrhoids were found during retroflexion. The       hemorrhoids were mild, small and Grade I (internal hemorrhoids that do       not prolapse).      The exam was otherwise without abnormality on direct and retroflexion       views. Impression:               - Diverticulosis in the sigmoid colon and in the                            descending colon.                           - Non-bleeding internal hemorrhoids.                           -  The examination was otherwise normal on direct                            and retroflexion views.                           - No specimens collected. Moderate Sedation:      Moderate (conscious) sedation was personally administered by an       anesthesia professional. The following parameters were monitored: oxygen       saturation, heart rate, blood pressure, respiratory rate, EKG, adequacy       of pulmonary ventilation, and response to care. Recommendation:           - Patient has a contact number available for                            emergencies. The signs and symptoms of potential                             delayed complications were discussed with the                            patient. Return to normal activities tomorrow.                            Written discharge instructions were provided to the                            patient.                           - Advance diet as tolerated.                           - Continue present medications.                           - No repeat colonoscopy due to age.                           - Return to GI clinic PRN. Procedure Code(s):        --- Professional ---                           [phone], Colonoscopy, flexible; diagnostic, including                            collection of specimen(s) by brushing or washing,                            when performed (separate procedure) Diagnosis Code(s):        --- Professional ---                           Z12.11, Encounter for screening for malignant  neoplasm of colon                           K64.0, First degree hemorrhoids                           K57.30, Diverticulosis of large intestine without                            perforation or abscess without bleeding CPT copyright 2019 American Medical Association. All rights reserved. The codes documented in this report are preliminary and upon coder review may  be revised to meet current compliance requirements. Gerrit Friends. Nehemiah Mcfarren, MD Gennette Pac, MD 10/22/2019 8:10:35 AM This report has been signed electronically. Number of Addenda: 0

## 2020-02-08 ENCOUNTER — Telehealth: Payer: Self-pay

## 2020-02-08 NOTE — Telephone Encounter (Signed)
Scheduled the patient for follow-up 5/28. He states the paperwork just needs to be signed by Dr. Excell Seltzer confirming he is under his care and prescribes his heart medications.  Will review paperwork with Dr. Excell Seltzer on Monday.

## 2020-02-08 NOTE — Telephone Encounter (Signed)
Called patient to set up appointment as paperwork (Customer Medical Report) was received and he is overdue for a visit.  Left message to call back.

## 2020-02-18 NOTE — Telephone Encounter (Signed)
Per Dr. Excell Seltzer, will await visit to fill out paperwork.

## 2020-02-29 ENCOUNTER — Ambulatory Visit: Payer: Medicare Other | Admitting: Cardiovascular Disease

## 2020-03-05 NOTE — Telephone Encounter (Signed)
The patient rescheduled his appointment to 6/4.  Dr. Excell Seltzer will discuss paperwork with him at that time.

## 2020-03-07 ENCOUNTER — Ambulatory Visit (INDEPENDENT_AMBULATORY_CARE_PROVIDER_SITE_OTHER): Payer: Medicare Other | Admitting: Cardiovascular Disease

## 2020-03-07 ENCOUNTER — Other Ambulatory Visit: Payer: Self-pay

## 2020-03-07 ENCOUNTER — Encounter: Payer: Self-pay | Admitting: Cardiovascular Disease

## 2020-03-07 VITALS — BP 114/70 | HR 65 | Ht 70.0 in | Wt 173.0 lb

## 2020-03-07 DIAGNOSIS — E782 Mixed hyperlipidemia: Secondary | ICD-10-CM | POA: Diagnosis not present
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
## 2020-03-07 NOTE — Patient Instructions (Signed)
Medication Instructions:  Your physician recommends that you continue on your current medications as directed. Please refer to the Current Medication list given to you today.  *If you need a refill on your cardiac medications before your next appointment, please call your pharmacy*   Lab Work: None If you have labs (blood work) drawn today and your tests are completely normal, you will receive your results only by: . MyChart Message (if you have MyChart) OR . A paper copy in the mail If you have any lab test that is abnormal or we need to change your treatment, we will call you to review the results.   Testing/Procedures: None   Follow-Up: At [facility], you and your health needs are our priority.  As part of our continuing mission to provide you with exceptional heart care, we have created designated Provider Care Teams.  These Care Teams include your primary Cardiologist (physician) and Advanced Practice Providers (APPs -  Physician Assistants and Nurse Practitioners) who all work together to provide you with the care you need, when you need it.  We recommend signing up for the patient portal called "MyChart".  Sign up information is provided on this After Visit Summary.  MyChart is used to connect with patients for Virtual Visits (Telemedicine).  Patients are able to view lab/test results, encounter notes, upcoming appointments, etc.  Non-urgent messages can be sent to your provider as well.   To learn more about what you can do with MyChart, go to [email or web address].    Your next appointment:   12 month(s)  The format for your next appointment:   In Person  Provider:   You may see Michael Cooper, MD or one of the following Advanced Practice Providers on your designated Care Team:    Scott Weaver, PA-C  Vin Bhagat, PA-C    Other Instructions   

## 2020-04-10 ENCOUNTER — Other Ambulatory Visit: Payer: Self-pay | Admitting: Cardiovascular Disease

## 2020-04-10 DIAGNOSIS — E785 Hyperlipidemia, unspecified: Secondary | ICD-10-CM

## 2020-05-19 ENCOUNTER — Other Ambulatory Visit: Payer: Self-pay | Admitting: Cardiovascular Disease

## 2020-08-11 ENCOUNTER — Other Ambulatory Visit: Payer: Self-pay | Admitting: Nephrology

## 2020-08-11 DIAGNOSIS — N2889 Other specified disorders of kidney and ureter: Secondary | ICD-10-CM

## 2020-09-05 ENCOUNTER — Other Ambulatory Visit: Payer: Self-pay

## 2020-09-05 ENCOUNTER — Other Ambulatory Visit: Payer: Self-pay | Admitting: Nephrology

## 2020-09-05 ENCOUNTER — Ambulatory Visit
Admission: RE | Admit: 2020-09-05 | Discharge: 2020-09-05 | Disposition: A | Discharge status: Home / Self Care | Payer: Medicare Other | Source: Ambulatory Visit | Attending: Nephrology | Admitting: Nephrology

## 2020-09-05 DIAGNOSIS — N2889 Other specified disorders of kidney and ureter: Secondary | ICD-10-CM

## 2020-09-05 MED ORDER — GADOBENATE DIMEGLUMINE 529 MG/ML IV SOLN
16.0000 mL | Freq: Once | INTRAVENOUS | Status: AC | PRN
  Administered 2020-09-05: 16 mL via INTRAVENOUS

## 2020-12-30 ENCOUNTER — Telehealth: Payer: Self-pay | Admitting: Cardiovascular Disease

## 2020-12-30 NOTE — Telephone Encounter (Signed)
New message:     Dr.Oneil calling to get a apt with Dr. Excell Seltzer do not have anything.

## 2021-01-14 NOTE — Telephone Encounter (Signed)
Scheduled the patient for visit with Dr. Excell Seltzer on 04/27/2021. He was grateful for call and agrees with plan.

## 2021-03-20 ENCOUNTER — Other Ambulatory Visit: Payer: Self-pay | Admitting: Cardiovascular Disease

## 2021-04-27 ENCOUNTER — Encounter: Payer: Self-pay | Admitting: Cardiovascular Disease

## 2021-04-27 ENCOUNTER — Other Ambulatory Visit: Payer: Self-pay

## 2021-04-27 ENCOUNTER — Other Ambulatory Visit: Payer: Self-pay | Admitting: Cardiovascular Disease

## 2021-04-27 ENCOUNTER — Ambulatory Visit (INDEPENDENT_AMBULATORY_CARE_PROVIDER_SITE_OTHER): Payer: Medicare Other | Admitting: Cardiovascular Disease

## 2021-04-27 VITALS — BP 124/80 | HR 73 | Ht 70.0 in | Wt 175.8 lb

## 2021-04-27 DIAGNOSIS — E782 Mixed hyperlipidemia: Secondary | ICD-10-CM

## 2021-04-27 DIAGNOSIS — E785 Hyperlipidemia, unspecified: Secondary | ICD-10-CM

## 2021-04-27 MED ORDER — ROSUVASTATIN CALCIUM 10 MG PO TABS: 10.0000 mg | ORAL_TABLET | Freq: Every day | ORAL | 3 refills | Status: DC

## 2021-04-27 NOTE — Patient Instructions (Signed)
Medication Instructions:  1) DISCONTINUE Mevacor and Niacin 2) START Rosuvastatin 10mg  once daily  *If you need a refill on your cardiac medications before your next appointment, please call your pharmacy*   Lab Work: Lipids, Liver function and CK level in 8 weeks  If you have labs (blood work) drawn today and your tests are completely normal, you will receive your results only by: MyChart Message (if you have MyChart) OR A paper copy in the mail If you have any lab test that is abnormal or we need to change your treatment, we will call you to review the results.   Testing/Procedures: None   Follow-Up: At [facility], you and your health needs are our priority.  As part of our continuing mission to provide you with exceptional heart care, we have created designated Provider Care Teams.  These Care Teams include your primary Cardiologist (physician) and Advanced Practice Providers (APPs -  Physician Assistants and Nurse Practitioners) who all work together to provide you with the care you need, when you need it.  We recommend signing up for the patient portal called "MyChart".  Sign up information is provided on this After Visit Summary.  MyChart is used to connect with patients for Virtual Visits (Telemedicine).  Patients are able to view lab/test results, encounter notes, upcoming appointments, etc.  Non-urgent messages can be sent to your provider as well.   To learn more about what you can do with MyChart, go to [facility].    Your next appointment:   1 year(s)  The format for your next appointment:   In Person  Provider:   You may see [email or web address], MD or one of the following Advanced Practice Providers on your designated Care Team:   Tonny Bollman, PA-C Tereso Newcomer, Chelsea Aus   Other Instructions

## 2021-04-27 NOTE — Progress Notes (Signed)
Cardiology Office Note:    Date:  04/27/2021   ID:  Derron, Pipkins 1946/02/11, MRN [id number]  PCP:  Allison Quarry, PA   [facility] Providers Cardiologist:  Tonny Bollman, MD     Referring MD: Allison Quarry, [place]   Chief Complaint  Patient presents with   Hyperlipidemia     History of Present Illness:    JADYN BARGE is a 75 y.o. male with a hx of mixed hyperlipidemia and family history of CAD, presenting for follow-up evaluation.  The patient has been on long-term therapy with lovastatin, niacin, and Zetia.  He has had elevated CK levels without symptoms of myositis.  He has followed this and his CK levels normalized and have started trending upward again.  He reports that his most recent level was in the range of 5-600 with the upper limit of normal about 300.  He denies any muscle aches or weakness.  He swims for exercise and has no exertional symptoms.  He specifically denies chest pain, chest pressure, shortness of breath, edema, heart palpitations, orthopnea, or PND.  Past Medical History:  Diagnosis Date   Bipolar disorder (HCC)    Mixed hyperlipidemia     Past Surgical History:  Procedure Laterality Date   APPENDECTOMY     age 30   COLONOSCOPY WITH PROPOFOL N/A 10/22/2019   Procedure: COLONOSCOPY WITH PROPOFOL;  Surgeon: Corbin Ade, MD;  [facility];  Service: Endoscopy;  Laterality: N/A;  7:30am   INGUINAL HERNIA REPAIR Bilateral     Current Medications: Current Meds  Medication Sig   aspirin EC 81 MG tablet Take 81 mg by mouth daily.   clonazePAM (KLONOPIN) 0.5 MG tablet Take 0.5 mg by mouth at bedtime.   divalproex (DEPAKOTE ER) 500 MG 24 hr tablet Take 500 mg by mouth at bedtime.    escitalopram (LEXAPRO) 20 MG tablet Take 20 mg by mouth daily.   ezetimibe (ZETIA) 10 MG tablet TAKE 1 TABLET DAILY   mupirocin ointment (BACTROBAN) 2 %    omega-3 acid ethyl esters (LOVAZA) 1 g capsule TAKE 1 CAPSULE FOUR TIMES A DAY    tretinoin (RETIN-A) 0.025 % cream    triamcinolone cream (KENALOG) 0.1 %    UNABLE TO FIND once a week. Med Name: Allergy Shot   Vitamin D, Ergocalciferol, (DRISDOL) 50000 units CAPS capsule Take 50,000 Units by mouth every Monday.    [DISCONTINUED] lovastatin (MEVACOR) 40 MG tablet TAKE 1 TABLET AT BEDTIME   [DISCONTINUED] niacin 500 MG tablet Take 1 tablet (500 mg total) by mouth at bedtime.   [DISCONTINUED] rosuvastatin (CRESTOR) 10 MG tablet Take 1 tablet (10 mg total) by mouth daily.     Allergies:   Tetracycline   Social History   Socioeconomic History   Marital status: Married    Spouse name: Not on file   Number of children: Not on file   Years of education: Not on file   Highest education level: Not on file  Occupational History   Not on file  Tobacco Use   Smoking status: Never   Smokeless tobacco: Never  Substance and Sexual Activity   Alcohol use: No    Comment: Prior use but quit 2 years ago   Drug use: No   Sexual activity: Not on file  Other Topics Concern   Not on file  Social History Narrative   Not on file   Social Determinants of Health   Financial Resource Strain: Not  on file  Food Insecurity: Not on file  Transportation Needs: Not on file  Physical Activity: Not on file  Stress: Not on file  Social Connections: Not on file     Family History: The patient's family history includes Colon cancer in his maternal aunt; Coronary artery disease in an other family member; Depression in his mother; Heart disease in his father; Hyperlipidemia in an other family member.  ROS:   Please see the history of present illness.    All other systems reviewed and are negative.  EKGs/Labs/Other Studies Reviewed:     EKG:  EKG is not ordered today.    Recent Labs: No results found for requested labs within last 8760 hours.  Recent Lipid Panel    Component Value Date/Time   CHOL 113 05/06/2009 0000   TRIG 31 05/06/2009 0000   HDL 52 05/06/2009 0000    LDLCALC 55 05/06/2009 0000     Risk Assessment/Calculations:           Physical Exam:    VS:  BP 124/80   Pulse 73   Ht 5\' 10"  (1.778 m)   Wt 175 lb 12.8 oz (79.7 kg)   SpO2 95%   BMI 25.22 kg/m     Wt Readings from Last 3 Encounters:  04/27/21 175 lb 12.8 oz (79.7 kg)  03/07/20 173 lb (78.5 kg)  07/13/19 180 lb 9.6 oz (81.9 kg)     GEN:  Well nourished, well developed in no acute distress HEENT: Normal NECK: No JVD; No carotid bruits LYMPHATICS: No lymphadenopathy CARDIAC: RRR, no murmurs, rubs, gallops RESPIRATORY:  Clear to auscultation without rales, wheezing or rhonchi  ABDOMEN: Soft, non-tender, non-distended MUSCULOSKELETAL:  No edema; No deformity  SKIN: Warm and dry NEUROLOGIC:  Alert and oriented x 3 PSYCHIATRIC:  Normal affect   ASSESSMENT:    1. Mixed hyperlipidemia    PLAN:    In order of problems listed above:  Patient with abnormal muscle enzymes with elevated CK levels.  I recommended that he discontinue Mevacor and niacin.  We discussed lack of evidence for niacin and the primary prevention especially in conjunction with statin drugs.  I have recommended starting rosuvastatin 10 mg daily.  He will remain on ezetimibe.  We will check lipids and LFTs as well as a CK in about 8 weeks.  The patient has his labs drawn near his home in 09/12/19 and sends [place] results via fax.        Medication Adjustments/Labs and Tests Ordered: Current medicines are reviewed at length with the patient today.  Concerns regarding medicines are outlined above.  No orders of the defined types were placed in this encounter.  Meds ordered this encounter  Medications   DISCONTD: rosuvastatin (CRESTOR) 10 MG tablet    Sig: Take 1 tablet (10 mg total) by mouth daily.    Dispense:  90 tablet    Refill:  3    D/c Mevacor and Niacin   rosuvastatin (CRESTOR) 10 MG tablet    Sig: Take 1 tablet (10 mg total) by mouth daily.    Dispense:  90 tablet    Refill:  3    D/c  Mevacor and Niacin    Patient Instructions  Medication Instructions:  1) DISCONTINUE Mevacor and Niacin 2) START Rosuvastatin 10mg  once daily  *If you need a refill on your cardiac medications before your next appointment, please call your pharmacy*   Lab Work: Lipids, Liver function and CK level in 8 weeks  If you have labs (blood work) drawn today and your tests are completely normal, you will receive your results only by: MyChart Message (if you have MyChart) OR A paper copy in the mail If you have any lab test that is abnormal or we need to change your treatment, we will call you to review the results.   Testing/Procedures: None   Follow-Up: At [facility], you and your health needs are our priority.  As part of our continuing mission to provide you with exceptional heart care, we have created designated Provider Care Teams.  These Care Teams include your primary Cardiologist (physician) and Advanced Practice Providers (APPs -  Physician Assistants and Nurse Practitioners) who all work together to provide you with the care you need, when you need it.  We recommend signing up for the patient portal called "MyChart".  Sign up information is provided on this After Visit Summary.  MyChart is used to connect with patients for Virtual Visits (Telemedicine).  Patients are able to view lab/test results, encounter notes, upcoming appointments, etc.  Non-urgent messages can be sent to your provider as well.   To learn more about what you can do with MyChart, go to [email or web address].    Your next appointment:   1 year(s)  The format for your next appointment:   In Person  Provider:   You may see Tonny Bollman, MD or one of the following Advanced Practice Providers on your designated Care Team:   Tereso Newcomer, PA-C Chelsea Aus, [place]   Other Instructions     Signed, Tonny Bollman, MD  04/27/2021 3:57 PM    [facility]

## 2021-08-07 ENCOUNTER — Other Ambulatory Visit: Payer: Self-pay | Admitting: Nephrology

## 2021-08-10 ENCOUNTER — Other Ambulatory Visit: Payer: Self-pay | Admitting: Nephrology

## 2021-08-10 DIAGNOSIS — N50811 Right testicular pain: Secondary | ICD-10-CM

## 2021-08-12 ENCOUNTER — Other Ambulatory Visit: Payer: Self-pay | Admitting: Nephrology

## 2021-08-12 ENCOUNTER — Other Ambulatory Visit: Payer: Self-pay | Admitting: Pulmonary Disease

## 2021-08-12 DIAGNOSIS — N183 Chronic kidney disease, stage 3 unspecified: Secondary | ICD-10-CM

## 2021-08-12 DIAGNOSIS — N2889 Other specified disorders of kidney and ureter: Secondary | ICD-10-CM

## 2021-08-13 ENCOUNTER — Ambulatory Visit
Admission: RE | Admit: 2021-08-13 | Discharge: 2021-08-13 | Disposition: A | Discharge status: Home / Self Care | Payer: Medicare Other | Source: Ambulatory Visit | Attending: Nephrology | Admitting: Nephrology

## 2021-08-13 DIAGNOSIS — N2889 Other specified disorders of kidney and ureter: Secondary | ICD-10-CM

## 2021-08-13 DIAGNOSIS — N183 Chronic kidney disease, stage 3 unspecified: Secondary | ICD-10-CM

## 2021-08-13 DIAGNOSIS — N50811 Right testicular pain: Secondary | ICD-10-CM

## 2021-09-07 ENCOUNTER — Other Ambulatory Visit: Payer: Self-pay | Admitting: Nephrology

## 2021-09-07 DIAGNOSIS — N289 Disorder of kidney and ureter, unspecified: Secondary | ICD-10-CM

## 2021-09-22 ENCOUNTER — Other Ambulatory Visit: Payer: Medicare Other

## 2021-09-23 ENCOUNTER — Ambulatory Visit
Admission: RE | Admit: 2021-09-23 | Discharge: 2021-09-23 | Disposition: A | Discharge status: Home / Self Care | Payer: Medicare Other | Source: Ambulatory Visit | Attending: Nephrology | Admitting: Nephrology

## 2021-09-23 DIAGNOSIS — N289 Disorder of kidney and ureter, unspecified: Secondary | ICD-10-CM

## 2021-09-23 MED ORDER — GADOBENATE DIMEGLUMINE 529 MG/ML IV SOLN
15.0000 mL | Freq: Once | INTRAVENOUS | Status: AC | PRN
  Administered 2021-09-23: 09:00:00 15 mL via INTRAVENOUS

## 2021-10-02 ENCOUNTER — Ambulatory Visit (INDEPENDENT_AMBULATORY_CARE_PROVIDER_SITE_OTHER): Payer: Medicare Other | Admitting: Urology

## 2021-10-02 ENCOUNTER — Other Ambulatory Visit: Payer: Self-pay

## 2021-10-02 ENCOUNTER — Encounter: Payer: Self-pay | Admitting: Urology

## 2021-10-02 VITALS — BP 127/77 | HR 69 | Wt 175.0 lb

## 2021-10-02 DIAGNOSIS — R3129 Other microscopic hematuria: Secondary | ICD-10-CM | POA: Diagnosis not present

## 2021-10-02 DIAGNOSIS — N281 Cyst of kidney, acquired: Secondary | ICD-10-CM | POA: Insufficient documentation

## 2021-10-02 DIAGNOSIS — Z87898 Personal history of other specified conditions: Secondary | ICD-10-CM | POA: Diagnosis not present

## 2021-10-02 DIAGNOSIS — N1330 Unspecified hydronephrosis: Secondary | ICD-10-CM | POA: Diagnosis not present

## 2021-10-02 LAB — URINALYSIS, ROUTINE W REFLEX MICROSCOPIC
Bilirubin, UA: NEGATIVE
Glucose, UA: NEGATIVE
Leukocytes,UA: NEGATIVE
Nitrite, UA: NEGATIVE
Protein,UA: NEGATIVE
RBC, UA: NEGATIVE
Specific Gravity, UA: 1.025 (ref 1.005–1.030)
Urobilinogen, Ur: 0.2 mg/dL (ref 0.2–1.0)
pH, UA: 6 (ref 5.0–7.5)

## 2021-10-02 NOTE — Progress Notes (Signed)
Assessment: 1. Hydronephrosis of right kidney   2. Acquired complex renal cyst; right, Bosniak 2   3. Microscopic hematuria   4. History of gross hematuria       Plan: I reviewed the patient's chart including recent lab results and imaging results.  I discussed these results at length with the patient. Today I had a discussion with the patient regarding the findings of gross hematuria including the implications and differential diagnoses associated with it.  I also discussed recommendations for further evaluation including the rationale for upper tract imaging and cystoscopy.  I discussed the nature of these procedures including potential risk and complications.  The patient expressed an understanding of these issues. CT renal stone protocol at [facility] Return for cystoscopy DRE next visit   Chief Complaint:  Chief Complaint  Patient presents with   Hematuria   History of Present Illness:  William Krueger is a 75 y.o. year old male and retired Higher education careers adviser ICU/Pulmonology MD is seen in consultation from Allison Quarry, [place] for evaluation of an episode of gross hematuria which occurred 2 months ago.  Patient states he flew for approximately 4 hours to visit relatives and had some urgency the following day with 1 episode of gross hematuria which resolved with increased fluid intake.  Urine was Coca-Cola colored.  He has no history of hematuria in the past and reports no history of stones.  He has had some intermittent ache in the right flank area.  Pt sees nephrology for history of CKD noting elevated creatinine and decreased GFR.  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  Renal ultrasound obtained in November 2022 revealed mild right-sided hydronephrosis as well as a stable complex right renal cyst..  Follow-up MRI from 12/22 indicated Bosniak 2 right renal cyst to be stable with no findings  of hydronephrosis.  Patient reports no history of DRE in the past. Today, he denies urinary sxs. Current UA negative 08/10/21 PSA 0.91 IPSS 1 Pt voids 0-1 times/night No history of tobacco use. He is a retired Financial trader.   Past Medical History:  Past Medical History:  Diagnosis Date   Bipolar disorder (HCC)    Mixed hyperlipidemia     Past Surgical History:  Past Surgical History:  Procedure Laterality Date   APPENDECTOMY     age 30   COLONOSCOPY WITH PROPOFOL N/A 10/22/2019   Procedure: COLONOSCOPY WITH PROPOFOL;  Surgeon: Corbin Ade, MD;  [facility];  Service: Endoscopy;  Laterality: N/A;  7:30am   INGUINAL HERNIA REPAIR Bilateral     Allergies:  Allergies  Allergen Reactions   Tetracycline Rash and Hives    Family History:  Family History  Problem Relation Age of Onset   Depression Mother    Heart disease Father    Coronary artery disease Other        family history   Hyperlipidemia Other        family history   Colon cancer Maternal Aunt        Diagnosed at age greater than 15.      Social History:  Social History   Tobacco Use   Smoking status: Never   Smokeless tobacco: Never  Substance Use Topics   Alcohol use: No    Comment: Prior use but quit 2 years ago   Drug use: No  Review of symptoms:  Constitutional:  Negative for unexplained weight loss, night sweats, fever, chills ENT:  Negative for nose bleeds, sinus pain, painful swallowing CV:  Negative for chest pain, shortness of breath, exercise intolerance, palpitations, loss of consciousness Resp:  Negative for cough, wheezing, shortness of breath GI:  Negative for nausea, vomiting, diarrhea, bloody stools GU:  Positives noted in HPI; otherwise negative for dysuria, urinary incontinence Neuro:  Negative for seizures, poor balance, limb weakness, slurred speech Psych:  Negative for lack of energy, depression, anxiety Endocrine:  Negative  for polydipsia, polyuria, symptoms of hypoglycemia (dizziness, hunger, sweating) Hematologic:  Negative for anemia, purpura, petechia, prolonged or excessive bleeding, use of anticoagulants  Allergic:  Negative for difficulty breathing or choking as a result of exposure to anything; no shellfish allergy; no allergic response (rash/itch) to materials, foods  Physical exam: BP 127/77    Pulse 69    Wt 175 lb (79.4 kg)    BMI 25.11 kg/m  GENERAL APPEARANCE:  Well appearing, well developed, well nourished, NAD HEENT: Atraumatic, Normocephalic, oropharynx clear. NECK: Supple  LUNGS: Unlabored respiratory effort.  Scattered expiratory wheezes right lung fields HEART: Regular Rate and Rhythm without murmurs, gallops, or rubs. ABDOMEN: Soft, non-tender, No Masses. EXTREMITIES: Moves all extremities well.  Without clubbing, cyanosis, or edema. NEUROLOGIC:  Alert and oriented x 3, normal gait, CN II-XII grossly intact.  MENTAL STATUS:  Appropriate. BACK:  Non-tender to palpation.  No CVAT SKIN:  Warm, dry and intact.    Results: UA negatve today

## 2021-10-21 ENCOUNTER — Ambulatory Visit
Admission: RE | Admit: 2021-10-21 | Discharge: 2021-10-21 | Disposition: A | Discharge status: Home / Self Care | Payer: Medicare Other | Source: Ambulatory Visit | Attending: Urology | Admitting: Urology

## 2021-10-21 ENCOUNTER — Other Ambulatory Visit: Payer: Self-pay

## 2021-10-21 DIAGNOSIS — R3129 Other microscopic hematuria: Secondary | ICD-10-CM

## 2021-10-21 DIAGNOSIS — Z87898 Personal history of other specified conditions: Secondary | ICD-10-CM

## 2021-10-23 ENCOUNTER — Other Ambulatory Visit: Payer: Self-pay

## 2021-10-23 ENCOUNTER — Encounter: Payer: Self-pay | Admitting: Urology

## 2021-10-23 ENCOUNTER — Ambulatory Visit (INDEPENDENT_AMBULATORY_CARE_PROVIDER_SITE_OTHER): Payer: Medicare Other | Admitting: Urology

## 2021-10-23 VITALS — BP 113/70 | HR 72 | Wt 170.0 lb

## 2021-10-23 DIAGNOSIS — N201 Calculus of ureter: Secondary | ICD-10-CM | POA: Diagnosis not present

## 2021-10-23 DIAGNOSIS — R310 Gross hematuria: Secondary | ICD-10-CM | POA: Diagnosis not present

## 2021-10-23 DIAGNOSIS — N200 Calculus of kidney: Secondary | ICD-10-CM

## 2021-10-23 DIAGNOSIS — N281 Cyst of kidney, acquired: Secondary | ICD-10-CM

## 2021-10-23 DIAGNOSIS — Z87898 Personal history of other specified conditions: Secondary | ICD-10-CM | POA: Diagnosis not present

## 2021-10-23 LAB — URINALYSIS, ROUTINE W REFLEX MICROSCOPIC
Bilirubin, UA: NEGATIVE
Glucose, UA: NEGATIVE
Ketones, UA: NEGATIVE
Leukocytes,UA: NEGATIVE
Nitrite, UA: NEGATIVE
Protein,UA: NEGATIVE
RBC, UA: NEGATIVE
Specific Gravity, UA: 1.02 (ref 1.005–1.030)
Urobilinogen, Ur: 0.2 mg/dL (ref 0.2–1.0)
pH, UA: 6.5 (ref 5.0–7.5)

## 2021-10-23 MED ORDER — TAMSULOSIN HCL 0.4 MG PO CAPS: 0.4000 mg | ORAL_CAPSULE | Freq: Every day | ORAL | 1 refills | Status: DC

## 2021-10-23 NOTE — Progress Notes (Signed)
Urological Symptom Review  Patient is experiencing the following symptoms: Frequent urination Hard to postpone urination Burning/pain with urination   Review of Systems  Gastrointestinal (upper)  : Negative for upper GI symptoms  Gastrointestinal (lower) : Negative for lower GI symptoms  Constitutional : Negative for symptoms  Skin: Negative for skin symptoms  Eyes: Negative for eye symptoms  Ear/Nose/Throat : Negative for Ear/Nose/Throat symptoms  Hematologic/Lymphatic: Negative for Hematologic/Lymphatic symptoms  Cardiovascular : Negative for cardiovascular symptoms  Respiratory : Negative for respiratory symptoms  Endocrine: Negative for endocrine symptoms  Musculoskeletal: Negative for musculoskeletal symptoms  Neurological: Negative for neurological symptoms  Psychologic: Negative for psychiatric symptoms  

## 2021-10-23 NOTE — Progress Notes (Signed)
Assessment: 1. History of gross hematuria   2. Nephrolithiasis   3. Acquired complex renal cyst; right, Bosniak 2   4. Ureteral calculus; right distal       Plan: I personally reviewed the CT study from 10/21/2021.  Upon my review, it appears that there is a 5 x 10 mm distal right ureteral calculus which is not causing obstruction.  I discussed these findings with the patient today.  Options for management of the distal calculus discussed including spontaneous passage, shockwave lithotripsy, or ureteroscopic laser lithotripsy.  Risk and benefits of each treatment modality reviewed. Following our discussion, he would like to attempt spontaneous passage as he is not currently symptomatic. Prescription for Flomax 0.4 mg daily provided. Strain urine. I also discussed the findings of a pulmonary nodule and recommendations for follow-up.  He was given a copy of the CT report.  He will be contacting a colleague at [facility] regarding further evaluation.   Cipro x1 following cystoscopy. Return to office in 7-10 days with preappointment KUB  Chief Complaint:  Chief Complaint  Patient presents with   Hematuria   History of Present Illness:  William Krueger is a 76 y.o. year old male seen for further evaluation of an episode of gross hematuria which occurred 2 months ago.  Patient states he flew for approximately 4 hours to visit relatives and had some urgency the following day with 1 episode of gross hematuria which resolved with increased fluid intake.  Urine was Coca-Cola colored.  He has no history of hematuria in the past and reports no history of stones.  He has had some intermittent ache in the right flank area.  Pt sees nephrology for history of CKD noting elevated creatinine and decreased GFR.  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  Renal ultrasound obtained in November 2022 revealed mild right-sided  hydronephrosis as well as a stable complex right renal cyst..  Follow-up MRI from 12/22 indicated Bosniak 2 right renal cyst to be stable with no findings of hydronephrosis.   08/10/21 PSA 0.91 Pt voids 0-1 times/night No history of tobacco use. He is a retired Media planner. Urinalysis at his last visit showed no evidence of hematuria.  CT abdomen and pelvis without contrast from 10/22/2021 demonstrated several nonobstructing calculi within the right kidney measuring up to 3 mm in the mid and lower pole, no left nephrolithiasis, multiple renal cortical cyst and left parapelvic cyst, no evidence of ureteral calculi or hydronephrosis.  A 12 x 9 mm pulmonary nodule in the left lower lobe was also noted.  He presents today for cystoscopy. No further gross hematuria.  He does have nocturia x1.  He reports some very mild intermittent discomfort in the right lower abdomen and right flank.  No nausea, vomiting, fevers, or chills. IPSS = 1 today.  Portions of the above documentation were copied from a prior visit for review purposes only.   Past Medical History:  Past Medical History:  Diagnosis Date   Bipolar disorder ([place])    Mixed hyperlipidemia     Past Surgical History:  Past Surgical History:  Procedure Laterality Date   APPENDECTOMY     age 39   COLONOSCOPY WITH PROPOFOL N/A 10/22/2019   Procedure: COLONOSCOPY WITH PROPOFOL;  Surgeon: Daneil Dolin, MD;  [facility];  Service: Endoscopy;  Laterality: N/A;  7:30am  INGUINAL HERNIA REPAIR Bilateral     Allergies:  Allergies  Allergen Reactions   Tetracycline Rash and Hives    Family History:  Family History  Problem Relation Age of Onset   Depression Mother    Heart disease Father    Coronary artery disease Other        family history   Hyperlipidemia Other        family history   Colon cancer Maternal Aunt        Diagnosed at age greater than 58.      Social History:   Social History   Tobacco Use   Smoking status: Never   Smokeless tobacco: Never  Substance Use Topics   Alcohol use: No    Comment: Prior use but quit 2 years ago   Drug use: No    ROS: Constitutional:  Negative for fever, chills, weight loss CV: Negative for chest pain, previous MI, hypertension Respiratory:  Negative for shortness of breath, wheezing, sleep apnea, frequent cough GI:  Negative for nausea, vomiting, bloody stool, GERD  Physical exam: BP 113/70    Pulse 72    Wt 170 lb (77.1 kg)    BMI 24.39 kg/m  GENERAL APPEARANCE:  Well appearing, well developed, well nourished, NAD HEENT:  Atraumatic, normocephalic, oropharynx clear NECK:  Supple without lymphadenopathy or thyromegaly ABDOMEN:  Soft, non-tender, no masses EXTREMITIES:  Moves all extremities well, without clubbing, cyanosis, or edema NEUROLOGIC:  Alert and oriented x 3, normal gait, CN II-XII grossly intact MENTAL STATUS:  appropriate BACK:  Non-tender to palpation, No CVAT SKIN:  Warm, dry, and intact GU: Penis:  circumcised Meatus: Normal Scrotum: normal, no masses Testis: normal without masses bilateral Prostate: 30 g, NT, no nodules Rectum: Normal tone,  no masses or tenderness   Results: U/A dipstick negative  Procedure:  Flexible Cystourethroscopy  Pre-operative Diagnosis: Gross hematuria  Post-operative Diagnosis: Gross hematuria  Anesthesia:  local with lidocaine jelly  Surgical Narrative:  After appropriate informed consent was obtained, the patient was prepped and draped in the usual sterile fashion in the supine position.  The patient was correctly identified and the proper procedure delineated prior to proceeding.  Sterile lidocaine gel was instilled in the urethra. The flexible cystoscope was introduced without difficulty.  Findings:  Anterior urethra: Normal  Posterior urethra: Lateral lobe hypertrophy  Bladder: Normal  Ureteral orifices: normal  Additional findings:  None  Saline bladder wash for cytology was not performed.    The cystoscope was then removed.  The patient tolerated the procedure well.

## 2021-10-26 ENCOUNTER — Other Ambulatory Visit: Payer: Medicare Other

## 2021-10-28 ENCOUNTER — Other Ambulatory Visit: Payer: Medicare Other

## 2021-10-29 ENCOUNTER — Ambulatory Visit: Payer: Medicare Other | Admitting: Urology

## 2021-11-03 ENCOUNTER — Ambulatory Visit (INDEPENDENT_AMBULATORY_CARE_PROVIDER_SITE_OTHER): Payer: Medicare Other | Admitting: Urology

## 2021-11-03 ENCOUNTER — Other Ambulatory Visit: Payer: Self-pay

## 2021-11-03 ENCOUNTER — Encounter: Payer: Self-pay | Admitting: Urology

## 2021-11-03 ENCOUNTER — Ambulatory Visit (HOSPITAL_COMMUNITY)
Admission: RE | Admit: 2021-11-03 | Discharge: 2021-11-03 | Disposition: A | Discharge status: Home / Self Care | Payer: Medicare Other | Source: Ambulatory Visit | Attending: Urology | Admitting: Urology

## 2021-11-03 VITALS — BP 123/70 | HR 76

## 2021-11-03 DIAGNOSIS — N201 Calculus of ureter: Secondary | ICD-10-CM

## 2021-11-03 DIAGNOSIS — N200 Calculus of kidney: Secondary | ICD-10-CM

## 2021-11-03 DIAGNOSIS — Z87898 Personal history of other specified conditions: Secondary | ICD-10-CM | POA: Diagnosis not present

## 2021-11-03 MED ORDER — TAMSULOSIN HCL 0.4 MG PO CAPS: 0.4000 mg | ORAL_CAPSULE | Freq: Every day | ORAL | 1 refills | Status: DC

## 2021-11-03 NOTE — Progress Notes (Signed)
Assessment: 1. Ureteral calculus; right distal   2. Nephrolithiasis   3. History of gross hematuria        Plan: I reviewed the KUB from today showing persistent calcifications in the area of the right distal ureter. Options for management of the distal ureteral calculi again discussed including spontaneous passage, shockwave lithotripsy, and ureteroscopic stone manipulation.  He would like to continue to attempt spontaneous passage as he is currently asymptomatic. Continue tamsulosin. Strain urine. Return to office in approximately 3 weeks with preappointment KUB. He is interested in proceeding with shockwave lithotripsy if he has not passed the stones at that time.  Chief Complaint:  Chief Complaint  Patient presents with   ureteral calculus   History of Present Illness:  William Krueger is a 76 y.o. year old male seen for further evaluation of an episode of gross hematuria which occurred 2 months ago.  Patient states he flew for approximately 4 hours to visit relatives and had some urgency the following day with 1 episode of gross hematuria which resolved with increased fluid intake.  Urine was Coca-Cola colored.  He has no history of hematuria in the past and reports no history of stones.  He has had some intermittent ache in the right flank area.  Pt sees nephrology for history of CKD noting elevated creatinine and decreased GFR.  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  Renal ultrasound obtained in November 2022 revealed mild right-sided hydronephrosis as well as a stable complex right renal cyst..  Follow-up MRI from 12/22 indicated Bosniak 2 right renal cyst to be stable with no findings of hydronephrosis.   08/10/21 PSA 0.91 Pt voids 0-1 times/night No history of tobacco use. He is a retired Media planner. Urinalysis at his follow-up visit showed no  evidence of hematuria.  CT abdomen and pelvis without contrast from 10/22/2021 demonstrated several nonobstructing calculi within the right kidney measuring up to 3 mm in the mid and lower pole, no left nephrolithiasis, multiple renal cortical cyst and left parapelvic cyst, and a 5 x 10 mm distal right ureteral calculus without obstruction.  A 12 x 9 mm pulmonary nodule in the left lower lobe was also noted. At his visit on 10/23/21, he had no further episodes of gross hematuria.  He had nocturia x1.  He reported some very mild intermittent discomfort in the right lower abdomen and right flank.  No nausea, vomiting, fevers, or chills. IPSS = 1. Cystoscopy from 10/23/21 showed lateral lobe enlargement of the prostate but no bladder abnormalities. Treatment options for the right distal ureteral calculus discussed.  He was started on tamsulosin 0.4 mg daily.  He returns today for scheduled follow-up.  No flank pain.  No episodes of gross hematuria. KUB from 11/03/2021 shows 2 adjacent 5 mm calculi in the area of the distal right ureter unchanged from his CT scan on 10/21/2021.   He has not started the tamsulosin.  No stones passed.   Portions of the above documentation were copied from a prior visit for review purposes only.   Past Medical History:  Past Medical History:  Diagnosis Date   Bipolar disorder ([place])    Mixed hyperlipidemia     Past Surgical History:  Past Surgical History:  Procedure Laterality Date   APPENDECTOMY     age 31   COLONOSCOPY WITH PROPOFOL N/A 10/22/2019  Procedure: COLONOSCOPY WITH PROPOFOL;  Surgeon: Daneil Dolin, MD;  [facility];  Service: Endoscopy;  Laterality: N/A;  7:30am   INGUINAL HERNIA REPAIR Bilateral     Allergies:  Allergies  Allergen Reactions   Fluorouracil Rash   Tetracycline Rash and Hives    Family History:  Family History  Problem Relation Age of Onset   Depression Mother    Heart disease Father    Coronary artery  disease Other        family history   Hyperlipidemia Other        family history   Colon cancer Maternal Aunt        Diagnosed at age greater than 27.      Social History:  Social History   Tobacco Use   Smoking status: Never   Smokeless tobacco: Never  Substance Use Topics   Alcohol use: No    Comment: Prior use but quit 2 years ago   Drug use: No    ROS: Constitutional:  Negative for fever, chills, weight loss CV: Negative for chest pain, previous MI, hypertension Respiratory:  Negative for shortness of breath, wheezing, sleep apnea, frequent cough GI:  Negative for nausea, vomiting, bloody stool, GERD  Physical exam: BP 123/70    Pulse 76  GENERAL APPEARANCE:  Well appearing, well developed, well nourished, NAD HEENT:  Atraumatic, normocephalic, oropharynx clear NECK:  Supple without lymphadenopathy or thyromegaly ABDOMEN:  Soft, non-tender, no masses EXTREMITIES:  Moves all extremities well, without clubbing, cyanosis, or edema NEUROLOGIC:  Alert and oriented x 3, normal gait, CN II-XII grossly intact MENTAL STATUS:  appropriate BACK:  Non-tender to palpation, No CVAT SKIN:  Warm, dry, and intact   Results: U/A dipstick negative

## 2021-11-04 LAB — URINALYSIS, ROUTINE W REFLEX MICROSCOPIC
Bilirubin, UA: NEGATIVE
Glucose, UA: NEGATIVE
Ketones, UA: NEGATIVE
Leukocytes,UA: NEGATIVE
Nitrite, UA: NEGATIVE
Protein,UA: NEGATIVE
RBC, UA: NEGATIVE
Specific Gravity, UA: 1.015 (ref 1.005–1.030)
Urobilinogen, Ur: 0.2 mg/dL (ref 0.2–1.0)
pH, UA: 6.5 (ref 5.0–7.5)

## 2021-11-05 ENCOUNTER — Telehealth: Payer: Self-pay

## 2021-11-05 ENCOUNTER — Other Ambulatory Visit: Payer: Self-pay

## 2021-11-05 DIAGNOSIS — N201 Calculus of ureter: Secondary | ICD-10-CM

## 2021-11-05 MED ORDER — TAMSULOSIN HCL 0.4 MG PO CAPS: 0.4000 mg | ORAL_CAPSULE | Freq: Every day | ORAL | 3 refills | Status: DC

## 2021-11-05 NOTE — Telephone Encounter (Signed)
Rx sent to mail order as patient requested.

## 2021-11-05 NOTE — Telephone Encounter (Signed)
I was going to refill this for 90 days but I noticed rx has on 14 day supply... is this correct?

## 2021-11-05 NOTE — Telephone Encounter (Signed)
Patient called and left a vm advising he wanted to see if a RX for Flomax could be sent in for a 90 day supply to [place], [place].

## 2021-11-18 ENCOUNTER — Other Ambulatory Visit: Payer: Self-pay

## 2021-11-24 ENCOUNTER — Other Ambulatory Visit: Payer: Self-pay

## 2021-11-24 ENCOUNTER — Encounter: Payer: Self-pay | Admitting: Urology

## 2021-11-24 ENCOUNTER — Ambulatory Visit (HOSPITAL_COMMUNITY)
Admission: RE | Admit: 2021-11-24 | Discharge: 2021-11-24 | Disposition: A | Discharge status: Home / Self Care | Payer: Medicare Other | Source: Ambulatory Visit | Attending: Urology | Admitting: Urology

## 2021-11-24 ENCOUNTER — Other Ambulatory Visit: Payer: Self-pay | Admitting: Urology

## 2021-11-24 ENCOUNTER — Ambulatory Visit (INDEPENDENT_AMBULATORY_CARE_PROVIDER_SITE_OTHER): Payer: Medicare Other | Admitting: Urology

## 2021-11-24 VITALS — BP 124/70 | HR 74 | Wt 170.0 lb

## 2021-11-24 DIAGNOSIS — N201 Calculus of ureter: Secondary | ICD-10-CM

## 2021-11-24 DIAGNOSIS — N200 Calculus of kidney: Secondary | ICD-10-CM

## 2021-11-24 LAB — URINALYSIS, ROUTINE W REFLEX MICROSCOPIC
Bilirubin, UA: NEGATIVE
Glucose, UA: NEGATIVE
Ketones, UA: NEGATIVE
Leukocytes,UA: NEGATIVE
Nitrite, UA: NEGATIVE
Protein,UA: NEGATIVE
RBC, UA: NEGATIVE
Specific Gravity, UA: 1.02 (ref 1.005–1.030)
Urobilinogen, Ur: 0.2 mg/dL (ref 0.2–1.0)
pH, UA: 7 (ref 5.0–7.5)

## 2021-11-24 NOTE — Patient Instructions (Signed)
Dear Maisie Fus,   Thank you for choosing [facility] [place] to assist in your urologic care for your upcoming surgery. The following information below includes specific dates and details related to surgery:   The Surgical Procedure you are scheduled to have performed is Extracorporeal shock wave lithotripsy    Surgery Date: 12/01/2021   Physician performing the surgery: Dr Pete Glatter   Do not eat or drink after midnight the day before your surgery.    You will need a driver the day of surgery and will not be able to operate heavy machinery for 24 hours after.    Your surgery will be performed at  [facility] [address], [place] [address]   Enter at the Main Entrance and check in at the SAME DAY SURGERY desk.     Pre-Admit Testing Info   Pre- Admit appointments are interview with an anesthesiologist or a pre-operative anesthesia nurse. These appointments are typically completed as an in person visit but can take place over the telephone.  You will be contacted to confirm the date and time window.    If you have any questions or concerns, please don't hesitate to call the office at [phone]   Thank you,    Alfredo Martinez, RN Clinical Surgery Coordinator [facility]

## 2021-11-24 NOTE — H&P (View-Only) (Signed)
Assessment: 1. Ureteral calculus; right distal     Plan: I personally reviewed the KUB from today showing persistent calcifications in the area of the right distal ureter.  I discussed these results with the patient today.  Options for management again reviewed.  He would like to proceed with shockwave lithotripsy. Continue tamsulosin. Strain urine.  Procedure: The patient will be scheduled for right ESL at [facility].  Surgical request is placed with the surgery schedulers and will be scheduled at the patient's/family request. Informed consent is given as documented below. Anesthesia: local  The patient does not have sleep apnea, history of MRSA, history of VRE, history of cardiac device requiring special anesthetic needs. Patient is stable and considered clear for surgical in an outpatient ambulatory surgery setting as well as patient hospital setting.  Consent for Operation or Procedure: Provider Certification I hereby certify that the nature, purpose, benefits, usual and most frequent risks of, and alternatives to, the operation or procedure have been explained to the patient (or person authorized to sign for the patient) either by me as responsible physician or by the provider who is to perform the operation or procedure. Time spent such that the patient/family has had an opportunity to ask questions, and that those questions have been answered. The patient or the patient's representative has been advised that selected tasks may be performed by assistants to the primary health care provider(s). I believe that the patient (or person authorized to sign for the patient) understands what has been explained, and has consented to the operation or procedure. No guarantees were implied or made.   Chief Complaint:  Chief Complaint  Patient presents with   ureteral calculus   History of Present Illness:  William Krueger is a 76 y.o. year old male seen for further evaluation of an episode  of gross hematuria which occurred 2 months ago.  Patient states he flew for approximately 4 hours to visit relatives and had some urgency the following day with 1 episode of gross hematuria which resolved with increased fluid intake.  Urine was Coca-Cola colored.  He has no history of hematuria in the past and reports no history of stones.  He has had some intermittent ache in the right flank area.  Pt sees nephrology for history of CKD noting elevated creatinine and decreased GFR.  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  Renal ultrasound obtained in November 2022 revealed mild right-sided hydronephrosis as well as a stable complex right renal cyst..  Follow-up MRI from 12/22 indicated Bosniak 2 right renal cyst to be stable with no findings of hydronephrosis.   08/10/21 PSA 0.91 Pt voids 0-1 times/night No history of tobacco use. He is a retired Media planner. Urinalysis at his follow-up visit showed no evidence of hematuria.  CT abdomen and pelvis without contrast from 10/22/2021 demonstrated several nonobstructing calculi within the right kidney measuring up to 3 mm in the mid and lower pole, no left nephrolithiasis, multiple renal cortical cyst and left parapelvic cyst, and a 5 x 10 mm distal right ureteral calculus without obstruction.  A 12 x 9 mm pulmonary nodule in the left lower lobe was also noted. At his visit on 10/23/21, he had no further episodes of gross hematuria.  He had nocturia x1.  He reported some very mild intermittent discomfort in the right lower abdomen and right flank.  No nausea, vomiting, fevers, or chills. IPSS = 1. Cystoscopy from 10/23/21 showed lateral lobe enlargement of the prostate but no bladder abnormalities. Treatment options for the right distal ureteral calculus discussed.  He was started on tamsulosin 0.4 mg daily.  KUB from 11/03/2021  shows 2 adjacent 5 mm calculi in the area of the distal right ureter unchanged from his CT scan on 10/21/2021.    He returns today for follow-up.  He has not passed any stones.  No flank pain.  No dysuria or gross hematuria. KUB from today shows a persistent cluster of calcifications in the right pelvis consistent with the known distal ureteral calculi.  Portions of the above documentation were copied from a prior visit for review purposes only.   Past Medical History:  Past Medical History:  Diagnosis Date   Bipolar disorder ([place])    Mixed hyperlipidemia     Past Surgical History:  Past Surgical History:  Procedure Laterality Date   APPENDECTOMY     age 61   COLONOSCOPY WITH PROPOFOL N/A 10/22/2019   Procedure: COLONOSCOPY WITH PROPOFOL;  Surgeon: Daneil Dolin, MD;  [facility];  Service: Endoscopy;  Laterality: N/A;  7:30am   INGUINAL HERNIA REPAIR Bilateral     Allergies:  Allergies  Allergen Reactions   Fluorouracil Rash   Tetracycline Rash and Hives    Family History:  Family History  Problem Relation Age of Onset   Depression Mother    Heart disease Father    Coronary artery disease Other        family history   Hyperlipidemia Other        family history   Colon cancer Maternal Aunt        Diagnosed at age greater than 98.      Social History:  Social History   Tobacco Use   Smoking status: Never   Smokeless tobacco: Never  Substance Use Topics   Alcohol use: No    Comment: Prior use but quit 2 years ago   Drug use: No    ROS: Constitutional:  Negative for fever, chills, weight loss CV: Negative for chest pain, previous MI, hypertension Respiratory:  Negative for shortness of breath, wheezing, sleep apnea, frequent cough GI:  Negative for nausea, vomiting, bloody stool, GERD  Physical exam: BP 124/70    Pulse 74    Wt 170 lb (77.1 kg)    BMI 24.39 kg/m  GENERAL APPEARANCE:  Well appearing, well developed, well nourished, NAD HEENT:  Atraumatic, Normocephalic, oropharynx clear. NECK: Supple without lymphadenopathy or thyromegaly. LUNGS: Clear to auscultation bilaterally. HEART: Regular Rate and Rhythm without murmurs, gallops, or rubs. ABDOMEN: Soft, non-tender, No Masses. EXTREMITIES: Moves all extremities well.  Without clubbing, cyanosis, or edema. NEUROLOGIC:  Alert and oriented x 3, normal gait, CN II-XII grossly intact.  MENTAL STATUS:  Appropriate. BACK:  Non-tender to palpation.  No CVAT SKIN:  Warm, dry and intact.     Results: Results for orders placed or performed in visit on 11/24/21 (from the past 24 hour(s))  Urinalysis, Routine w reflex microscopic     Status: None   Collection Time: 11/24/21  4:06 PM  Result Value Ref Range   Specific Gravity, UA 1.020 1.005 - 1.030   pH, UA 7.0 5.0 - 7.5   Color, UA Yellow Yellow   Appearance Ur Clear Clear   Leukocytes,UA Negative Negative   Protein,UA Negative Negative/Trace   Glucose, UA Negative Negative   Ketones, UA Negative Negative  RBC, UA Negative Negative   Bilirubin, UA Negative Negative   Urobilinogen, Ur 0.2 0.2 - 1.0 mg/dL   Nitrite, UA Negative Negative   Microscopic Examination Comment    Narrative   Performed at:  [facility] [address], [place], [place]  [id number] Lab Director: Stella, Phone:  [phone]

## 2021-11-24 NOTE — Progress Notes (Signed)
Surgical Physician Order [facility]  * Scheduling expectation :  12/01/21  *Length of Case: 60 minutes  *MD Preforming Case: Di Kindle, MD  *Assistant Needed: no  *Facility Preference: [facility]  *Clearance needed: no  *Anticoagulation Instructions: N/A  *Aspirin Instructions: Hold Aspirin  -Admit type: OUTpatient  -Anesthesia: Local  -Use Standing Orders: ESWL  *Diagnosis: Right Ureteral Stone  *Procedure: right  ESL  Additional orders: N/A  -Equipment:  none  -VTE Prophylaxis Standing Order SCDs       Other:   -Standing Lab Orders Per Anesthesia    Lab other: None  -Standing Test orders EKG/Chest x-ray per Anesthesia       Test other:   - Medications:  None  -Other orders:  N/A  *Post-op visit Date/Instructions:  1-2 week with KUB prior

## 2021-11-24 NOTE — Progress Notes (Signed)
I spoke with William Krueger. We have discussed possible surgery dates and 12/01/2021 was agreed upon by all parties. Patient given information about surgery date, what to expect pre-operatively and post operatively.    We discussed that a pre-op nurse will be calling to set up the pre-op visit that will take place prior to surgery. Informed patient that our office will communicate any additional care to be provided after surgery.    Patients questions or concerns were discussed during our call. Advised to call our office should there be any additional information, questions or concerns that arise. Patient verbalized understanding.

## 2021-11-24 NOTE — Progress Notes (Signed)
Assessment: 1. Ureteral calculus; right distal     Plan: I personally reviewed the KUB from today showing persistent calcifications in the area of the right distal ureter.  I discussed these results with the patient today.  Options for management again reviewed.  He would like to proceed with shockwave lithotripsy. Continue tamsulosin. Strain urine.  Procedure: The patient will be scheduled for right ESL at [facility].  Surgical request is placed with the surgery schedulers and will be scheduled at the patient's/family request. Informed consent is given as documented below. Anesthesia: local  The patient does not have sleep apnea, history of MRSA, history of VRE, history of cardiac device requiring special anesthetic needs. Patient is stable and considered clear for surgical in an outpatient ambulatory surgery setting as well as patient hospital setting.  Consent for Operation or Procedure: Provider Certification I hereby certify that the nature, purpose, benefits, usual and most frequent risks of, and alternatives to, the operation or procedure have been explained to the patient (or person authorized to sign for the patient) either by me as responsible physician or by the provider who is to perform the operation or procedure. Time spent such that the patient/family has had an opportunity to ask questions, and that those questions have been answered. The patient or the patient's representative has been advised that selected tasks may be performed by assistants to the primary health care provider(s). I believe that the patient (or person authorized to sign for the patient) understands what has been explained, and has consented to the operation or procedure. No guarantees were implied or made.   Chief Complaint:  Chief Complaint  Patient presents with   ureteral calculus   History of Present Illness:  William Krueger is a 76 y.o. year old male seen for further evaluation of an episode  of gross hematuria which occurred 2 months ago.  Patient states he flew for approximately 4 hours to visit relatives and had some urgency the following day with 1 episode of gross hematuria which resolved with increased fluid intake.  Urine was Coca-Cola colored.  He has no history of hematuria in the past and reports no history of stones.  He has had some intermittent ache in the right flank area.  Pt sees nephrology for history of CKD noting elevated creatinine and decreased GFR.  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  Renal ultrasound obtained in November 2022 revealed mild right-sided hydronephrosis as well as a stable complex right renal cyst..  Follow-up MRI from 12/22 indicated Bosniak 2 right renal cyst to be stable with no findings of hydronephrosis.   08/10/21 PSA 0.91 Pt voids 0-1 times/night No history of tobacco use. He is a retired Media planner. Urinalysis at his follow-up visit showed no evidence of hematuria.  CT abdomen and pelvis without contrast from 10/22/2021 demonstrated several nonobstructing calculi within the right kidney measuring up to 3 mm in the mid and lower pole, no left nephrolithiasis, multiple renal cortical cyst and left parapelvic cyst, and a 5 x 10 mm distal right ureteral calculus without obstruction.  A 12 x 9 mm pulmonary nodule in the left lower lobe was also noted. At his visit on 10/23/21, he had no further episodes of gross hematuria.  He had nocturia x1.  He reported some very mild intermittent discomfort in the right lower abdomen and right flank.  No nausea, vomiting, fevers, or chills. IPSS = 1. Cystoscopy from 10/23/21 showed lateral lobe enlargement of the prostate but no bladder abnormalities. Treatment options for the right distal ureteral calculus discussed.  He was started on tamsulosin 0.4 mg daily.  KUB from 11/03/2021  shows 2 adjacent 5 mm calculi in the area of the distal right ureter unchanged from his CT scan on 10/21/2021.    He returns today for follow-up.  He has not passed any stones.  No flank pain.  No dysuria or gross hematuria. KUB from today shows a persistent cluster of calcifications in the right pelvis consistent with the known distal ureteral calculi.  Portions of the above documentation were copied from a prior visit for review purposes only.   Past Medical History:  Past Medical History:  Diagnosis Date   Bipolar disorder ([place])    Mixed hyperlipidemia     Past Surgical History:  Past Surgical History:  Procedure Laterality Date   APPENDECTOMY     age 68   COLONOSCOPY WITH PROPOFOL N/A 10/22/2019   Procedure: COLONOSCOPY WITH PROPOFOL;  Surgeon: Daneil Dolin, MD;  [facility];  Service: Endoscopy;  Laterality: N/A;  7:30am   INGUINAL HERNIA REPAIR Bilateral     Allergies:  Allergies  Allergen Reactions   Fluorouracil Rash   Tetracycline Rash and Hives    Family History:  Family History  Problem Relation Age of Onset   Depression Mother    Heart disease Father    Coronary artery disease Other        family history   Hyperlipidemia Other        family history   Colon cancer Maternal Aunt        Diagnosed at age greater than 63.      Social History:  Social History   Tobacco Use   Smoking status: Never   Smokeless tobacco: Never  Substance Use Topics   Alcohol use: No    Comment: Prior use but quit 2 years ago   Drug use: No    ROS: Constitutional:  Negative for fever, chills, weight loss CV: Negative for chest pain, previous MI, hypertension Respiratory:  Negative for shortness of breath, wheezing, sleep apnea, frequent cough GI:  Negative for nausea, vomiting, bloody stool, GERD  Physical exam: BP 124/70    Pulse 74    Wt 170 lb (77.1 kg)    BMI 24.39 kg/m  GENERAL APPEARANCE:  Well appearing, well developed, well nourished, NAD HEENT:  Atraumatic, Normocephalic, oropharynx clear. NECK: Supple without lymphadenopathy or thyromegaly. LUNGS: Clear to auscultation bilaterally. HEART: Regular Rate and Rhythm without murmurs, gallops, or rubs. ABDOMEN: Soft, non-tender, No Masses. EXTREMITIES: Moves all extremities well.  Without clubbing, cyanosis, or edema. NEUROLOGIC:  Alert and oriented x 3, normal gait, CN II-XII grossly intact.  MENTAL STATUS:  Appropriate. BACK:  Non-tender to palpation.  No CVAT SKIN:  Warm, dry and intact.     Results: Results for orders placed or performed in visit on 11/24/21 (from the past 24 hour(s))  Urinalysis, Routine w reflex microscopic     Status: None   Collection Time: 11/24/21  4:06 PM  Result Value Ref Range   Specific Gravity, UA 1.020 1.005 - 1.030   pH, UA 7.0 5.0 - 7.5   Color, UA Yellow Yellow   Appearance Ur Clear Clear   Leukocytes,UA Negative Negative   Protein,UA Negative Negative/Trace   Glucose, UA Negative Negative   Ketones, UA Negative Negative  RBC, UA Negative Negative   Bilirubin, UA Negative Negative   Urobilinogen, Ur 0.2 0.2 - 1.0 mg/dL   Nitrite, UA Negative Negative   Microscopic Examination Comment    Narrative   Performed at:  [facility] [address] [place], [place]  [id number] Lab Director: Gillespie, Phone:  [phone]

## 2021-11-27 ENCOUNTER — Encounter (HOSPITAL_COMMUNITY): Payer: Self-pay

## 2021-11-27 ENCOUNTER — Encounter (HOSPITAL_COMMUNITY)
Admission: RE | Admit: 2021-11-27 | Discharge: 2021-11-27 | Disposition: A | Discharge status: Home / Self Care | Payer: Medicare Other | Source: Ambulatory Visit | Attending: Urology | Admitting: Urology

## 2021-11-27 HISTORY — DX: Chronic kidney disease, unspecified: N18.9

## 2021-11-27 HISTORY — DX: Anxiety disorder, unspecified: F41.9

## 2021-11-27 HISTORY — DX: Depression, unspecified: F32.A

## 2021-11-27 HISTORY — DX: Bipolar II disorder: F31.81

## 2021-11-27 HISTORY — DX: Hyperlipidemia, unspecified: E78.5

## 2021-12-01 ENCOUNTER — Ambulatory Visit (HOSPITAL_COMMUNITY): Payer: Medicare Other

## 2021-12-01 ENCOUNTER — Ambulatory Visit (HOSPITAL_COMMUNITY)
Admission: RE | Admit: 2021-12-01 | Discharge: 2021-12-01 | Disposition: A | Discharge status: Home / Self Care | Payer: Medicare Other | Attending: Urology | Admitting: Urology

## 2021-12-01 ENCOUNTER — Encounter (HOSPITAL_COMMUNITY)
Admission: RE | Disposition: A | Discharge status: Home / Self Care | Payer: Self-pay | Source: Home / Self Care | Attending: Urology

## 2021-12-01 DIAGNOSIS — E669 Obesity, unspecified: Secondary | ICD-10-CM | POA: Insufficient documentation

## 2021-12-01 DIAGNOSIS — N201 Calculus of ureter: Secondary | ICD-10-CM | POA: Diagnosis present

## 2021-12-01 HISTORY — PX: EXTRACORPOREAL SHOCK WAVE LITHOTRIPSY: SHX1557

## 2021-12-01 SURGERY — LITHOTRIPSY, ESWL
Anesthesia: LOCAL | Laterality: Right

## 2021-12-01 MED ORDER — SODIUM CHLORIDE 0.9 % IV SOLN: INTRAVENOUS | Status: DC

## 2021-12-01 MED ORDER — TRAMADOL HCL 50 MG PO TABS: 50.0000 mg | ORAL_TABLET | Freq: Four times a day (QID) | ORAL | 0 refills | Status: DC | PRN

## 2021-12-01 MED ORDER — DIPHENHYDRAMINE HCL 25 MG PO CAPS
25.0000 mg | ORAL_CAPSULE | ORAL | Status: AC
  Administered 2021-12-01: 25 mg via ORAL
  Filled 2021-12-01: qty 1

## 2021-12-01 MED ORDER — DIAZEPAM 5 MG PO TABS
10.0000 mg | ORAL_TABLET | Freq: Once | ORAL | Status: AC
  Administered 2021-12-01: 10 mg via ORAL
  Filled 2021-12-01: qty 2

## 2021-12-01 NOTE — Interval H&P Note (Signed)
History and Physical Interval Note:  12/01/2021 8:37 AM  William Krueger  has presented today for surgery, with the diagnosis of Right Ureteral Stone.  The various methods of treatment have been discussed with the patient and family. After consideration of risks, benefits and other options for treatment, the patient has consented to  Procedure(s): EXTRACORPOREAL SHOCK WAVE LITHOTRIPSY (ESWL) (Right) as a surgical intervention.  The patient's history has been reviewed, patient examined, no change in status, stable for surgery.  I have reviewed the patient's chart and labs.  Questions were answered to the patient's satisfaction.     Di Kindle

## 2021-12-04 ENCOUNTER — Encounter (HOSPITAL_COMMUNITY): Payer: Self-pay | Admitting: Urology

## 2021-12-15 ENCOUNTER — Other Ambulatory Visit: Payer: Self-pay

## 2021-12-15 ENCOUNTER — Ambulatory Visit (INDEPENDENT_AMBULATORY_CARE_PROVIDER_SITE_OTHER): Payer: Medicare Other | Admitting: Urology

## 2021-12-15 ENCOUNTER — Encounter: Payer: Self-pay | Admitting: Urology

## 2021-12-15 ENCOUNTER — Ambulatory Visit (HOSPITAL_COMMUNITY)
Admission: RE | Admit: 2021-12-15 | Discharge: 2021-12-15 | Disposition: A | Discharge status: Home / Self Care | Payer: Medicare Other | Source: Ambulatory Visit | Attending: Urology | Admitting: Urology

## 2021-12-15 VITALS — BP 116/66 | HR 71

## 2021-12-15 DIAGNOSIS — N281 Cyst of kidney, acquired: Secondary | ICD-10-CM

## 2021-12-15 DIAGNOSIS — N201 Calculus of ureter: Secondary | ICD-10-CM

## 2021-12-15 DIAGNOSIS — N200 Calculus of kidney: Secondary | ICD-10-CM

## 2021-12-15 DIAGNOSIS — Z87898 Personal history of other specified conditions: Secondary | ICD-10-CM

## 2021-12-15 NOTE — Progress Notes (Signed)
? ?Assessment: ?1. Ureteral calculus   ?2. Nephrolithiasis   ?3. History of gross hematuria   ?4. Acquired complex renal cyst; right, Bosniak 2   ? ? ? ?Plan: ?I reviewed the KUB study from today.  A 7 x 3 mm calcification is seen in the area of the distal right ureter but has changed in appearance from prior imaging studies. ?Continue tamsulosin ?Return to office in 2 weeks with KUB ? ?Chief Complaint:  ?Chief Complaint  ?Patient presents with  ? ureteral calculus  ? ?History of Present Illness: ? ?William Krueger is a 76 y.o. year old male seen for further evaluation of an episode of gross hematuria which occurred 2 months ago.  Patient states he flew for approximately 4 hours to visit relatives and had some urgency the following day with 1 episode of gross hematuria which resolved with increased fluid intake.  Urine was Coca-Cola colored.  He has no history of hematuria in the past and reports no history of stones.  He has had some intermittent ache in the right flank area.  Pt sees nephrology for history of CKD noting elevated creatinine and decreased GFR.  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  Renal ultrasound obtained in November 2022 revealed mild right-sided hydronephrosis as well as a stable complex right renal cyst..  Follow-up MRI from 12/22 indicated Bosniak 2 right renal cyst to be stable with no findings of hydronephrosis.   ?08/10/21 PSA 0.91 ?Pt voids 0-1 times/night ?No history of tobacco use. ?He is a retired Financial trader. ?Urinalysis at his follow-up visit showed no evidence of hematuria. ? ?CT abdomen and pelvis without contrast from 10/22/2021 demonstrated several nonobstructing calculi within the right kidney measuring up to 3 mm in the mid and lower pole, no left nephrolithiasis, multiple renal cortical cyst and left parapelvic cyst, and a 5 x 10 mm distal right  ureteral calculus without obstruction.  A 12 x 9 mm pulmonary nodule in the left lower lobe was also noted. ?At his visit on 10/23/21, he had no further episodes of gross hematuria.  He had nocturia x1.  He reported some very mild intermittent discomfort in the right lower abdomen and right flank.  No nausea, vomiting, fevers, or chills. ?IPSS = 1. ?Cystoscopy from 10/23/21 showed lateral lobe enlargement of the prostate but no bladder abnormalities. ?Treatment options for the right distal ureteral calculus discussed.  He was started on tamsulosin 0.4 mg daily. ? ?KUB from 11/03/2021 shows 2 adjacent 5 mm calculi in the area of the distal right ureter unchanged from his CT scan on 10/21/2021.   ?KUB from 11/24/21 showed a persistent cluster of calcifications in the right pelvis consistent with the known distal ureteral calculi. ? ?He underwent right ESL on December 07, 2021. ?He has passed some stone fragments.  He is not having any flank pain.  No new urinary symptoms.  No dysuria or gross hematuria. ? ?Portions of the above documentation were copied from a prior visit for review purposes only. ? ? ?Past Medical History:  ?Past Medical History:  ?Diagnosis Date  ? Anxiety   ? 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 Bipolar disorder ([facility])   ? Chronic kidney disease   ? cyst on right kidney  ? Depression   ? Hyperlipidemia   ? Mixed hyperlipidemia   ? ? ?Past Surgical History:  ?Past Surgical History:  ?  Procedure Laterality Date  ? APPENDECTOMY    ? age 76  ? COLONOSCOPY WITH PROPOFOL N/A 10/22/2019  ? Procedure: COLONOSCOPY WITH PROPOFOL;  Surgeon: Corbin Ade, MD;  [facility];  Service: Endoscopy;  Laterality: N/A;  7:30am  ? EXTRACORPOREAL SHOCK WAVE LITHOTRIPSY Right 12/01/2021  ? Procedure: EXTRACORPOREAL SHOCK WAVE LITHOTRIPSY (ESWL);  Surgeon: Milderd Meager., MD;  Location: [facility];  Service: Urology;  Laterality: Right;  ? INGUINAL HERNIA REPAIR Bilateral   ? ? ?Allergies:  ?Allergies  ?Allergen Reactions  ?  Fluorouracil Rash  ? Tetracycline Rash and Hives  ? ? ?Family History:  ?Family History  ?Problem Relation Age of Onset  ? Depression Mother   ? Heart disease Father   ? Coronary artery disease Other   ?     family history  ? Hyperlipidemia Other   ?     family history  ? Colon cancer Maternal Aunt   ?     Diagnosed at age greater than 34.    ? ? ?Social History:  ?Social History  ? ?Tobacco Use  ? Smoking status: Never  ? Smokeless tobacco: Never  ?Substance Use Topics  ? Alcohol use: No  ?  Comment: Prior use but quit 2 years ago  ? Drug use: No  ? ? ?ROS: ?Constitutional:  Negative for fever, chills, weight loss ?CV: Negative for chest pain, previous MI, hypertension ?Respiratory:  Negative for shortness of breath, wheezing, sleep apnea, frequent cough ?GI:  Negative for nausea, vomiting, bloody stool, GERD ? ?Physical exam: ?BP 116/66   Pulse 71  ?GENERAL APPEARANCE:  Well appearing, well developed, well nourished, NAD ?HEENT:  Atraumatic, normocephalic, oropharynx clear ?NECK:  Supple without lymphadenopathy or thyromegaly ?ABDOMEN:  Soft, non-tender, no masses ?EXTREMITIES:  Moves all extremities well, without clubbing, cyanosis, or edema ?NEUROLOGIC:  Alert and oriented x 3, normal gait, CN II-XII grossly intact ?MENTAL STATUS:  appropriate ?BACK:  Non-tender to palpation, No CVAT ?SKIN:  Warm, dry, and intact ? ? ?Results: ?U/A:  dipstick negative ? ? ? ?

## 2021-12-16 LAB — URINALYSIS, ROUTINE W REFLEX MICROSCOPIC
Bilirubin, UA: NEGATIVE
Glucose, UA: NEGATIVE
Ketones, UA: NEGATIVE
Leukocytes,UA: NEGATIVE
Nitrite, UA: NEGATIVE
Protein,UA: NEGATIVE
RBC, UA: NEGATIVE
Specific Gravity, UA: 1.015 (ref 1.005–1.030)
Urobilinogen, Ur: 0.2 mg/dL (ref 0.2–1.0)
pH, UA: 6.5 (ref 5.0–7.5)

## 2022-01-05 ENCOUNTER — Ambulatory Visit: Payer: Medicare Other | Admitting: Urology

## 2022-02-02 ENCOUNTER — Telehealth: Payer: Self-pay | Admitting: Physician Assistant

## 2022-02-02 NOTE — Telephone Encounter (Signed)
William Krueger would like her husband Jayc Golaszewski to see dr hunter as PCP - is this ok?  ?

## 2022-02-03 NOTE — Telephone Encounter (Signed)
Yes, notate pt wife name in appt notes. ?

## 2022-02-12 ENCOUNTER — Other Ambulatory Visit: Payer: Self-pay | Admitting: Physician Assistant

## 2022-02-12 DIAGNOSIS — R911 Solitary pulmonary nodule: Secondary | ICD-10-CM

## 2022-03-10 ENCOUNTER — Ambulatory Visit
Admission: RE | Admit: 2022-03-10 | Discharge: 2022-03-10 | Disposition: A | Discharge status: Home / Self Care | Payer: Medicare Other | Source: Ambulatory Visit | Attending: Physician Assistant | Admitting: Physician Assistant

## 2022-03-10 DIAGNOSIS — R911 Solitary pulmonary nodule: Secondary | ICD-10-CM

## 2022-04-12 ENCOUNTER — Other Ambulatory Visit: Payer: Self-pay | Admitting: Cardiovascular Disease

## 2022-04-21 ENCOUNTER — Other Ambulatory Visit: Payer: Self-pay | Admitting: Cardiovascular Disease

## 2022-04-21 DIAGNOSIS — E785 Hyperlipidemia, unspecified: Secondary | ICD-10-CM

## 2022-04-30 ENCOUNTER — Encounter: Payer: Self-pay | Admitting: Family Medicine

## 2022-04-30 ENCOUNTER — Ambulatory Visit (INDEPENDENT_AMBULATORY_CARE_PROVIDER_SITE_OTHER): Payer: Medicare Other | Admitting: Family Medicine

## 2022-04-30 VITALS — BP 120/70 | HR 71 | Temp 97.7°F | Ht 69.0 in | Wt 181.2 lb

## 2022-04-30 DIAGNOSIS — E782 Mixed hyperlipidemia: Secondary | ICD-10-CM

## 2022-04-30 DIAGNOSIS — N401 Enlarged prostate with lower urinary tract symptoms: Secondary | ICD-10-CM

## 2022-04-30 DIAGNOSIS — E559 Vitamin D deficiency, unspecified: Secondary | ICD-10-CM

## 2022-04-30 DIAGNOSIS — N281 Cyst of kidney, acquired: Secondary | ICD-10-CM

## 2022-04-30 DIAGNOSIS — F3181 Bipolar II disorder: Secondary | ICD-10-CM | POA: Diagnosis not present

## 2022-04-30 DIAGNOSIS — I700 Atherosclerosis of aorta: Secondary | ICD-10-CM

## 2022-04-30 DIAGNOSIS — R748 Abnormal levels of other serum enzymes: Secondary | ICD-10-CM

## 2022-04-30 DIAGNOSIS — N200 Calculus of kidney: Secondary | ICD-10-CM | POA: Diagnosis not present

## 2022-04-30 DIAGNOSIS — H9193 Unspecified hearing loss, bilateral: Secondary | ICD-10-CM

## 2022-04-30 DIAGNOSIS — N201 Calculus of ureter: Secondary | ICD-10-CM

## 2022-04-30 DIAGNOSIS — R351 Nocturia: Secondary | ICD-10-CM

## 2022-04-30 MED ORDER — VITAMIN D (ERGOCALCIFEROL) 1.25 MG (50000 UNIT) PO CAPS: 50000.0000 [IU] | ORAL_CAPSULE | ORAL | 3 refills | Status: DC

## 2022-04-30 MED ORDER — TAMSULOSIN HCL 0.4 MG PO CAPS: 0.4000 mg | ORAL_CAPSULE | Freq: Every day | ORAL | 3 refills | Status: AC

## 2022-04-30 NOTE — Patient Instructions (Addendum)
Health Maintenance Due  Topic Date Due   COVID-19 Vaccine (1)-please send [place] the dates of vaccines Never done   Zoster Vaccines- Shingrix (1 of 2)-please send [place] the dates of your vaccines Never done   Pneumonia Vaccine 1+ Years old- please send [place] the dates of your vaccine 10/11/2014   Team please print labs that I ordered- he will have labs sent back to [place]  Recommended follow up: Return in about 1 year (around 05/01/2023) for followup or sooner if needed.Schedule b4 you leave.

## 2022-04-30 NOTE — Progress Notes (Signed)
Phone: [phone]   Subjective:  Patient presents today to establish care.  Prior patient of Felicie Morn PA in [place].  Chief Complaint  Patient presents with   Annual Exam    Not CPE as medicare   Hyperlipidemia   Benign Prostatic Hypertrophy   See problem oriented charting  The following were reviewed and entered/updated in epic: Past Medical History:  Diagnosis Date   Anxiety    Bipolar 2 disorder (HCC)    Mixed hyperlipidemia    Patient Active Problem List   Diagnosis Date Noted   Bipolar II disorder (HCC) 05/11/2012    Priority: High   Elevated CK 04/30/2022    Priority: Medium    Acquired complex renal cyst; right, Bosniak 2 10/02/2021    Priority: Medium    Mixed hyperlipidemia 05/05/2009    Priority: Medium    Nephrolithiasis 04/30/2022    Priority: Low   Vitamin D deficiency 04/30/2022    Priority: Low   Derang of medial meniscus due to old tear/inj, right knee 02/13/2015    Priority: Low   Past Surgical History:  Procedure Laterality Date   APPENDECTOMY     age 10   COLONOSCOPY WITH PROPOFOL N/A 78/18/2021   Procedure: COLONOSCOPY WITH PROPOFOL;  Surgeon: Corbin Ade, MD;  [facility];  Service: Endoscopy;  Laterality: N/A;  7:30am   EXTRACORPOREAL SHOCK WAVE LITHOTRIPSY Right 12/01/2021   Procedure: EXTRACORPOREAL SHOCK WAVE LITHOTRIPSY (ESWL);  Surgeon: Milderd Meager., MD;  Location: [facility];  Service: Urology;  Laterality: Right;   INGUINAL HERNIA REPAIR Bilateral    around 76    Family History  Problem Relation Age of Onset   Depression Mother        he thinks schizophrenic. improved after his fathers death interestingly   Stroke Mother        lived into late 59s- stroke, sepsis, MI later inlife   Heart disease Father        died at 68, other brothers died in 10s   Hyperlipidemia Father    Obesity Sister    Hypertension Sister    Hyperlipidemia Sister    Depression Sister    Colon cancer Maternal Aunt         Diagnosed at age greater than 28.     Coronary artery disease Other        family history- multiple uncles on dads side   Hyperlipidemia Other        family history    Medications- reviewed and updated Current Outpatient Medications  Medication Sig Dispense Refill   aspirin EC 81 MG tablet Take 81 mg by mouth daily.     clonazePAM (KLONOPIN) 0.5 MG tablet Take 0.5 mg by mouth at bedtime.     divalproex (DEPAKOTE ER) 500 MG 24 hr tablet Take 500 mg by mouth at bedtime.      escitalopram (LEXAPRO) 20 MG tablet Take 20 mg by mouth daily.     ezetimibe (ZETIA) 10 MG tablet TAKE 1 TABLET DAILY 30 tablet 0   mupirocin ointment (BACTROBAN) 2 % as needed.     omega-3 acid ethyl esters (LOVAZA) 1 g capsule TAKE 1 CAPSULE FOUR TIMES A DAY 120 capsule 0   rosuvastatin (CRESTOR) 10 MG tablet Take 1 tablet (10 mg total) by mouth daily. CALL AND SCHEDULE FOLLOW UP OFFICE VISIT TO GET FURTHER REFILLS. [phone]. 90 tablet 0   tretinoin (RETIN-A) 0.025 % cream      triamcinolone cream (KENALOG) 0.1 %  UNABLE TO FIND once a week. Med Name: Allergy Shot     metroNIDAZOLE (METROGEL) 1 % gel Apply 1 Application topically daily. As needed for rosacea- through Dr. Emily Filbert     tamsulosin (FLOMAX) 0.4 MG CAPS capsule Take 1 capsule (0.4 mg total) by mouth daily. 90 capsule 3   [START ON 05/03/2022] Vitamin D, Ergocalciferol, (DRISDOL) 1.25 MG (50000 UNIT) CAPS capsule Take 1 capsule (50,000 Units total) by mouth every Monday. 13 capsule 3   No current facility-administered medications for this visit.    Allergies-reviewed and updated Allergies  Allergen Reactions   Fluorouracil Rash   Tetracycline Rash and Hives    Social History   Social History Narrative   Married. 2 children (34 TJ with 19 sons, 78 year old married- horse and dogs) in 2023    Lives in [place].    Was in [employer] for 20 years      Retired pulmonology/intensive care- retired 2022   - enjoys allergy as well - side interest       Hobbies: swimming, flys once a week, trainer twice a week    Objective  Objective:  BP 120/70   Pulse 71   Temp 97.7 F (36.5 C)   Ht 5\' 9"  (1.753 m)   Wt 181 lb 3.2 oz (82.2 kg)   SpO2 97%   BMI 26.76 kg/m  Gen: NAD, resting comfortably HEENT: Mucous membranes are moist. Oropharynx normal. TM normal bilaterally Eyes: sclera and lids normal, PERRLA CV: RRR no murmurs rubs or gallops Lungs: CTAB no crackles, wheeze, rhonchi Abdomen: soft/nontender/nondistended/normal bowel sounds. No rebound or guarding.  Ext: no edema Skin: warm, dry Neuro: normal gait, normal speech   Assessment and Plan:   # Bipolar II/anxiety- managed by Dr. with [facility]  S:medication: Depakote 500mg  24 hr daily, lexapro 20 mg, clonazepam 0.5 mg just at night  A/P: well controlled- continue current meds and psychiatry follow up   #hyperlipidemia/hypertriglyceridemia #aortic atheroclerosis S: Medication:Rosuvastatin 10 mg daily, Zetia 10 mg daily, Lovaza, aspirin 81 mg -has had elevated CK in past without myositis (also sees Dr. Gennie Alma for renal cyst checked yearly)  -Cardiology-follows with Dr. -started on statin young due to strong family history- dad and uncles in 41s  A/P: hopefully well controlled- update lipids and continue current meds for now. Reports LDL under 70. Briefly discussed ct cardiac scoring- leans away from this -aortic atherosclerosis presumed stable- goal LDL 70 or less reports at goal- plus updating lipids  # BPH S:medication: tamsulosin 0.5 mg - reasonable control- nocturia still 1-2x a night- was 3x a night A/P: Controlled. Continue current medications. Refilled under my name -baseline psa around 1.1 - hed like to continue to trend   #Vitamin D deficiency S: Medication: 50k units per week -even with high dose for years remains around the 30s A/P: hopefully stable- update vitamin D with labs. Continue current meds for now   #Nephrolithiasis-follows with Dr.  Excell Seltzer with [facility]- yearly follow up. Initially presented with gross hematuria- had full workup led to stone being found- lithotripsy did not remove- ended up having retrival with Dr. 49s [facility].   #complex renal cyst- follows with DR. Colodonato with MRI yearly   #some hearing loss- will refer to audiology in [place]   Recommended follow up: Return in about 1 year (around 05/01/2023) for followup or sooner if needed.Schedule b4 you leave.    ICD-10-CM   1. Bipolar II disorder (HCC)  F31.81  2. Nephrolithiasis  N20.0     3. Mixed hyperlipidemia  E78.2 Comprehensive metabolic panel    CBC with Differential/Platelet    Lipid panel    TSH    4. Elevated CK  R74.8 CK (Creatine Kinase)    5. Vitamin D deficiency  E55.9 VITAMIN D 25 Hydroxy (Vit-D Deficiency, Fractures)    6. Acquired complex renal cyst; right, Bosniak 2  N28.1     7. Ureteral calculus; right distal  N20.1 tamsulosin (FLOMAX) 0.4 MG CAPS capsule       Meds ordered this encounter  Medications   tamsulosin (FLOMAX) 0.4 MG CAPS capsule    Sig: Take 1 capsule (0.4 mg total) by mouth daily.    Dispense:  90 capsule    Refill:  3   Vitamin D, Ergocalciferol, (DRISDOL) 1.25 MG (50000 UNIT) CAPS capsule    Sig: Take 1 capsule (50,000 Units total) by mouth every Monday.    Dispense:  13 capsule    Refill:  3   Return precautions advised. Tana Conch, MD

## 2022-05-05 LAB — CBC AND DIFFERENTIAL
HCT: 42 (ref 41–53)
Hemoglobin: 13.3 — AB (ref 13.5–17.5)
Platelets: 199 10*3/uL (ref 150–400)
WBC: 3.7

## 2022-05-05 LAB — CBC: RBC: 4.51 (ref 3.87–5.11)

## 2022-05-28 ENCOUNTER — Other Ambulatory Visit: Payer: Self-pay | Admitting: Cardiovascular Disease

## 2022-05-28 DIAGNOSIS — E785 Hyperlipidemia, unspecified: Secondary | ICD-10-CM

## 2022-06-07 ENCOUNTER — Other Ambulatory Visit: Payer: Self-pay | Admitting: Cardiovascular Disease

## 2022-06-07 DIAGNOSIS — E785 Hyperlipidemia, unspecified: Secondary | ICD-10-CM

## 2022-06-15 ENCOUNTER — Telehealth: Payer: Self-pay | Admitting: Cardiovascular Disease

## 2022-06-15 DIAGNOSIS — E785 Hyperlipidemia, unspecified: Secondary | ICD-10-CM

## 2022-06-15 MED ORDER — EZETIMIBE 10 MG PO TABS: ORAL_TABLET | ORAL | 0 refills | Status: DC

## 2022-06-15 NOTE — Telephone Encounter (Signed)
*  STAT* If patient is at the pharmacy, call can be transferred to refill team.   1. Which medications need to be refilled? (please list name of each medication and dose if known)   ezetimibe (ZETIA) 10 MG tablet  2. Which pharmacy/location (including street and city if local pharmacy) is medication to be sent to?  [facility] - [place], [place] - [address]  3. Do they need a 30 day or 90 day supply? 90 days  Patient stated he is down to his last 4 tablets.  Patient has appointment scheduled on 07/30/22.

## 2022-06-15 NOTE — Telephone Encounter (Signed)
Pt's medication was sent to pt's pharmacy as requested. Confirmation received.  °

## 2022-07-02 NOTE — Progress Notes (Unsigned)
Subjective:   William Krueger is a 76 y.o. male who presents for an Initial Medicare Annual Wellness Visit. I connected with  William Krueger on 07/05/22 by a audio enabled telemedicine application and verified that I am speaking with the correct person using two identifiers.  Patient Location: Home  Provider Location: Home Office  I discussed the limitations of evaluation and management by telemedicine. The patient expressed understanding and agreed to proceed.  Review of Systems    Deferred to PCP Cardiac Risk Factors include: advanced age (>62men, >78 women);male gender     Objective:    There were no vitals filed for this visit. There is no height or weight on file to calculate BMI.     07/05/2022    9:31 AM 12/01/2021    8:01 AM 11/27/2021    1:03 PM 10/22/2019    6:30 AM  Advanced Directives  Does Patient Have a Medical Advance Directive? Yes Yes No No  Type of Paramedic of [place];Living will [place];Living will    Does patient want to make changes to medical advance directive? No - Patient declined No - Patient declined    Copy of [place] in Chart? No - copy requested No - copy requested    Would patient like information on creating a medical advance directive?   No - Patient declined No - Patient declined    Current Medications (verified) Outpatient Encounter Medications as of 07/05/2022  Medication Sig   clonazePAM (KLONOPIN) 0.5 MG tablet Take 0.5 mg by mouth at bedtime.   divalproex (DEPAKOTE ER) 500 MG 24 hr tablet Take 500 mg by mouth at bedtime.    escitalopram (LEXAPRO) 20 MG tablet Take 20 mg by mouth daily.   ezetimibe (ZETIA) 10 MG tablet TAKE 1 TABLET BY MOUTH DAILY   metroNIDAZOLE (METROGEL) 1 % gel Apply 1 Application topically daily. As needed for rosacea- through Dr. Delman Cheadle   mupirocin ointment [facility]) 2 % as needed.   omega-3 acid ethyl esters (LOVAZA) 1 g capsule TAKE 1 CAPSULE  FOUR TIMES A DAY (CALL DOCTOR OFFICE TO SCHEDULE AN OVERDUE APPOINTMENT WITH DR Burt Knack BEFORE ANYMORE REFILLS. [phone])   rosuvastatin (CRESTOR) 10 MG tablet Take 1 tablet (10 mg total) by mouth daily. CALL AND SCHEDULE FOLLOW UP OFFICE VISIT TO GET FURTHER REFILLS. [phone].   tamsulosin (FLOMAX) 0.4 MG CAPS capsule Take 1 capsule (0.4 mg total) by mouth daily.   tretinoin (RETIN-A) 0.025 % cream    triamcinolone cream (KENALOG) 0.1 %    Vitamin D, Ergocalciferol, (DRISDOL) 1.25 MG (50000 UNIT) CAPS capsule Take 1 capsule (50,000 Units total) by mouth every Monday.   aspirin EC 81 MG tablet Take 81 mg by mouth daily. (Patient not taking: Reported on 07/05/2022)   UNABLE TO FIND once a week. Med Name: Allergy Shot (Patient not taking: Reported on 07/05/2022)   No facility-administered encounter medications on file as of 07/05/2022.    Allergies (verified) Fluorouracil and Tetracycline   History: Past Medical History:  Diagnosis Date   Anxiety    Bipolar 2 disorder ([place])    Mixed hyperlipidemia    Past Surgical History:  Procedure Laterality Date   APPENDECTOMY     age 24   COLONOSCOPY WITH PROPOFOL N/A 10/22/2019   Procedure: COLONOSCOPY WITH PROPOFOL;  Surgeon: Daneil Dolin, MD;  [facility];  Service: Endoscopy;  Laterality: N/A;  7:30am   EXTRACORPOREAL SHOCK WAVE LITHOTRIPSY Right  12/01/2021   Procedure: EXTRACORPOREAL SHOCK WAVE LITHOTRIPSY (ESWL);  Surgeon: Primus Bravo., MD;  Location: [facility];  Service: Urology;  Laterality: Right;   INGUINAL HERNIA REPAIR Bilateral    around 2000   Family History  Problem Relation Age of Onset   Depression Mother        he thinks schizophrenic. improved after his fathers death interestingly   Stroke Mother        lived into late 17s- stroke, sepsis, MI later inlife   Heart disease Father        died at 68, other brothers died in 26s   Hyperlipidemia Father    Obesity Sister    Hypertension Sister     Hyperlipidemia Sister    Depression Sister    Colon cancer Maternal Aunt        Diagnosed at age greater than 85.     Coronary artery disease Other        family history- multiple uncles on dads side   Hyperlipidemia Other        family history   Social History   Socioeconomic History   Marital status: Married    Spouse name: Opal Sidles   Number of children: 2   Years of education: Not on file   Highest education level: Doctorate  Occupational History   Not on file  Tobacco Use   Smoking status: Never   Smokeless tobacco: Never  Vaping Use   Vaping Use: Never used  Substance and Sexual Activity   Alcohol use: Yes    Comment: 6-7 glasses Kingstyn Deruiter a week   Drug use: No   Sexual activity: Yes    Partners: Female  Other Topics Concern   Not on file  Social History Narrative   Married. 2 children (38 TJ with 57 sons, 16 year old married- horse and dogs) in 2023    Lives in [place].    Was in [employer] for 20 years      Retired pulmonology/intensive care- retired 2022   - enjoys allergy as well - side interest      Hobbies: swimming, flys once a week, trainer twice a week   Social Determinants of Radio broadcast assistant Strain: Low Risk  (07/05/2022)   Overall Financial Resource Strain (CARDIA)    Difficulty of Paying Living Expenses: Not hard at all  Food Insecurity: No Food Insecurity (07/05/2022)   Hunger Vital Sign    Worried About Running Out of Food in the Last Year: Never true    [place] in the Last Year: Never true  Transportation Needs: No Transportation Needs (07/05/2022)   PRAPARE - Hydrologist (Medical): No    Lack of Transportation (Non-Medical): No  Physical Activity: Sufficiently Active (07/05/2022)   Exercise Vital Sign    Days of Exercise per Week: 6 days    Minutes of Exercise per Session: 40 min  Stress: No Stress Concern Present (07/05/2022)   [place]     Feeling of Stress : Only a little  Social Connections: Socially Integrated (07/05/2022)   Social Connection and Isolation Panel [NHANES]    Frequency of Communication with Friends and Family: More than three times a week    Frequency of Social Gatherings with Friends and Family: Twice a week    Attends Religious Services: 1 to 4 times per year    Active Member of [employer] or Organizations: Yes  Attends Music therapist: More than 4 times per year    Marital Status: Married    Tobacco Counseling Counseling given: Not Answered   Clinical Intake:  Pre-visit preparation completed: Yes  Pain : No/denies pain     Nutritional Status: BMI 25 -29 Overweight Nutritional Risks: Other (Comment), None Diabetes: No  How often do you need to have someone help you when you read instructions, pamphlets, or other written materials from your doctor or pharmacy?: 1 - Never What is the last grade level you completed in school?: doctorate  Diabetic?No  Interpreter Needed?: No  Information entered by :: Emelia Loron RN   Activities of Daily Living    07/05/2022    9:27 AM 11/27/2021    1:08 PM  In your present state of health, do you have any difficulty performing the following activities:  Hearing? 0   Vision? 0   Difficulty concentrating or making decisions? 0   Walking or climbing stairs? 0   Dressing or bathing? 0   Doing errands, shopping? 0 0  Preparing Food and eating ? N   Using the Toilet? N   In the past six months, have you accidently leaked urine? N   Do you have problems with loss of bowel control? N   Managing your Medications? N   Managing your Finances? N   Housekeeping or managing your Housekeeping? N     Patient Care Team: Marin Olp, MD as PCP - General (Family Medicine) Sherren Mocha, MD as PCP - Cardiology (Cardiology)  Indicate any recent Medical Services you may have received from other than [facility] providers in the past year (date may be  approximate).     Assessment:   This is a routine wellness examination for [facility].  Hearing/Vision screen No results found.  Dietary issues and exercise activities discussed: Current Exercise Habits: Home exercise routine, Type of exercise: walking;strength training/weights;calisthenics, Time (Minutes): 60, Frequency (Times/Week): 5, Weekly Exercise (Minutes/Week): 300, Intensity: Mild, Exercise limited by: orthopedic condition(s);Other - see comments   Goals Addressed             This Visit's Progress    Patient Stated       Continue to maintain my current health and wellness status.      Depression Screen    07/05/2022    9:41 AM  PHQ 2/9 Scores  PHQ - 2 Score 0    Fall Risk    07/05/2022    9:32 AM  [place] in the past year? 0  Number falls in past yr: 0  Injury with Fall? 0  Risk for fall due to : History of fall(s)  Follow up Falls evaluation completed    [place]:  Any stairs in or around the home? Yes  If so, are there any without handrails? Yes  Home free of loose throw rugs in walkways, pet beds, electrical cords, etc? Yes  Adequate lighting in your home to reduce risk of falls? Yes   ASSISTIVE DEVICES UTILIZED TO PREVENT FALLS:  Life alert? No  Use of a cane, walker or w/c? Yes  Grab bars in the bathroom? Yes  Shower chair or bench in shower? No  Elevated toilet seat or a handicapped toilet? No   Cognitive Function:        07/05/2022    9:33 AM  6CIT Screen  What Year? 0 points  What month? 0 points  What time? 0  points  Count back from 20 0 points  Months in reverse 0 points  Repeat phrase 0 points  Total Score 0 points    Immunizations Immunization History  Administered Date(s) Administered   Influenza-Unspecified 06/13/2013, 06/20/2014   Pneumococcal Conjugate-13 10/11/2013    TDAP status: Due, Education has been provided regarding the importance of this vaccine. Advised may receive  this vaccine at local pharmacy or Health Dept. Aware to provide a copy of the vaccination record if obtained from local pharmacy or Health Dept. Verbalized acceptance and understanding.  Flu Vaccine status: Due, Education has been provided regarding the importance of this vaccine. Advised may receive this vaccine at local pharmacy or Health Dept. Aware to provide a copy of the vaccination record if obtained from local pharmacy or Health Dept. Verbalized acceptance and understanding.  Pneumococcal vaccine status: Due, Education has been provided regarding the importance of this vaccine. Advised may receive this vaccine at local pharmacy or Health Dept. Aware to provide a copy of the vaccination record if obtained from local pharmacy or Health Dept. Verbalized acceptance and understanding.  Covid-19 vaccine status: Information provided on how to obtain vaccines.   Qualifies for Shingles Vaccine? Yes   Zostavax completed No   Shingrix Completed?: Yes, patient reports that he has had both of the vaccines  Screening Tests Health Maintenance  Topic Date Due   COVID-19 Vaccine (1) Never done   Zoster Vaccines- Shingrix (1 of 2) Never done   Pneumonia Vaccine 2+ Years old (2 - PPSV23 or PCV20) 10/11/2014   INFLUENZA VACCINE  01/02/2023 (Originally 05/04/2022)   TETANUS/TDAP  05/01/2023 (Originally 06/09/1965)   Hepatitis C Screening  05/01/2023 (Originally 06/08/1964)   HPV VACCINES  Aged Out   COLONOSCOPY (Pts 45-64yrs Insurance coverage will need to be confirmed)  Discontinued    Health Maintenance  Health Maintenance Due  Topic Date Due   COVID-19 Vaccine (1) Never done   Zoster Vaccines- Shingrix (1 of 2) Never done   Pneumonia Vaccine 65+ Years old (2 - PPSV23 or PCV20) 10/11/2014    Colorectal cancer screening: No longer required.   Lung Cancer Screening: (Low Dose CT Chest recommended if Age 36-80 years, 30 pack-year currently smoking OR have quit w/in 15years.) does not qualify.    Additional Screening:  Hepatitis C Screening: does qualify; Completed education provided  Vision Screening: Recommended annual ophthalmology exams for early detection of glaucoma and other disorders of the eye. Is the patient up to date with their annual eye exam?  Yes  Who is the provider or what is the name of the office in which the patient attends annual eye exams? Dr. Manuella Ghazi If pt is not established with a provider, would they like to be referred to a provider to establish care?  N/A .   Dental Screening: Recommended annual dental exams for proper oral hygiene  Community Resource Referral / Chronic Care Management: CRR required this visit?  No   CCM required this visit?  No      Plan:     I have personally reviewed and noted the following in the patient's chart:   Medical and social history Use of alcohol, tobacco or illicit drugs  Current medications and supplements including opioid prescriptions. Patient is not currently taking opioid prescriptions. Functional ability and status Nutritional status Physical activity Advanced directives List of other physicians Hospitalizations, surgeries, and ER visits in previous 12 months Vitals Screenings to include cognitive, depression, and falls Referrals and appointments  In addition, I  have reviewed and discussed with patient certain preventive protocols, quality metrics, and best practice recommendations. A written personalized care plan for preventive services as well as general preventive health recommendations were provided to patient.     Michiel Cowboy, RN   07/05/2022   Nurse Notes:  Mr. Zaitz , Thank you for taking time to come for your Medicare Wellness Visit. I appreciate your ongoing commitment to your health goals. Please review the following plan we discussed and let me know if I can assist you in the future.   These are the goals we discussed:  Goals      Patient Stated     Continue to maintain my current  health and wellness status.        This is a list of the screening recommended for you and due dates:  Health Maintenance  Topic Date Due   COVID-19 Vaccine (1) Never done   Zoster (Shingles) Vaccine (1 of 2) Never done   Pneumonia Vaccine (2 - PPSV23 or PCV20) 10/11/2014   Flu Shot  01/02/2023*   Tetanus Vaccine  05/01/2023*   Hepatitis C Screening: USPSTF Recommendation to screen - Ages 18-79 yo.  05/01/2023*   HPV Vaccine  Aged Out   Colon Cancer Screening  Discontinued  *Topic was postponed. The date shown is not the original due date.

## 2022-07-02 NOTE — Patient Instructions (Signed)
Health Maintenance, Male Adopting a healthy lifestyle and getting preventive care are important in promoting health and wellness. Ask your health care provider about: The right schedule for you to have regular tests and exams. Things you can do on your own to prevent diseases and keep yourself healthy. What should I know about diet, weight, and exercise? Eat a healthy diet  Eat a diet that includes plenty of vegetables, fruits, low-fat dairy products, and lean protein. Do not eat a lot of foods that are high in solid fats, added sugars, or sodium. Maintain a healthy weight Body mass index (BMI) is a measurement that can be used to identify possible weight problems. It estimates body fat based on height and weight. Your health care provider can help determine your BMI and help you achieve or maintain a healthy weight. Get regular exercise Get regular exercise. This is one of the most important things you can do for your health. Most adults should: Exercise for at least 150 minutes each week. The exercise should increase your heart rate and make you sweat (moderate-intensity exercise). Do strengthening exercises at least twice a week. This is in addition to the moderate-intensity exercise. Spend less time sitting. Even light physical activity can be beneficial. Watch cholesterol and blood lipids Have your blood tested for lipids and cholesterol at 78 years of age, then have this test every 5 years. You may need to have your cholesterol levels checked more often if: Your lipid or cholesterol levels are high. You are older than 78 years of age. You are at high risk for heart disease. What should I know about cancer screening? Many types of cancers can be detected early and may often be prevented. Depending on your health history and family history, you may need to have cancer screening at various ages. This may include screening for: Colorectal cancer. Prostate cancer. Skin cancer. Lung  cancer. What should I know about heart disease, diabetes, and high blood pressure? Blood pressure and heart disease High blood pressure causes heart disease and increases the risk of stroke. This is more likely to develop in people who have high blood pressure readings or are overweight. Talk with your health care provider about your target blood pressure readings. Have your blood pressure checked: Every 3-5 years if you are 18-39 years of age. Every year if you are 40 years old or older. If you are between the ages of 65 and 75 and are a current or former smoker, ask your health care provider if you should have a one-time screening for abdominal aortic aneurysm (AAA). Diabetes Have regular diabetes screenings. This checks your fasting blood sugar level. Have the screening done: Once every three years after age 45 if you are at a normal weight and have a low risk for diabetes. More often and at a younger age if you are overweight or have a high risk for diabetes. What should I know about preventing infection? Hepatitis B If you have a higher risk for hepatitis B, you should be screened for this virus. Talk with your health care provider to find out if you are at risk for hepatitis B infection. Hepatitis C Blood testing is recommended for: Everyone born from 1945 through 1965. Anyone with known risk factors for hepatitis C. Sexually transmitted infections (STIs) You should be screened each year for STIs, including gonorrhea and chlamydia, if: You are sexually active and are younger than 78 years of age. You are older than 78 years of age and your   health care provider tells you that you are at risk for this type of infection. Your sexual activity has changed since you were last screened, and you are at increased risk for chlamydia or gonorrhea. Ask your health care provider if you are at risk. Ask your health care provider about whether you are at high risk for HIV. Your health care provider  may recommend a prescription medicine to help prevent HIV infection. If you choose to take medicine to prevent HIV, you should first get tested for HIV. You should then be tested every 3 months for as long as you are taking the medicine. Follow these instructions at home: Alcohol use Do not drink alcohol if your health care provider tells you not to drink. If you drink alcohol: Limit how much you have to 0-2 drinks a day. Know how much alcohol is in your drink. In the [place] one drink equals one 12 oz bottle of beer (355 mL), one 5 oz glass of Sarajean Dessert (148 mL), or one 1 oz glass of hard liquor (44 mL). Lifestyle Do not use any products that contain nicotine or tobacco. These products include cigarettes, chewing tobacco, and vaping devices, such as e-cigarettes. If you need help quitting, ask your health care provider. Do not use street drugs. Do not share needles. Ask your health care provider for help if you need support or information about quitting drugs. General instructions Schedule regular health, dental, and eye exams. Stay current with your vaccines. Tell your health care provider if: You often feel depressed. You have ever been abused or do not feel safe at home. Summary Adopting a healthy lifestyle and getting preventive care are important in promoting health and wellness. Follow your health care provider's instructions about healthy diet, exercising, and getting tested or screened for diseases. Follow your health care provider's instructions on monitoring your cholesterol and blood pressure. This information is not intended to replace advice given to you by your health care provider. Make sure you discuss any questions you have with your health care provider. Document Revised: 02/09/2021 Document Reviewed: 02/09/2021 Elsevier Patient Education  2023 Elsevier Inc.  

## 2022-07-05 ENCOUNTER — Ambulatory Visit (INDEPENDENT_AMBULATORY_CARE_PROVIDER_SITE_OTHER): Payer: Medicare Other | Admitting: *Deleted

## 2022-07-05 DIAGNOSIS — Z0000 Encounter for general adult medical examination without abnormal findings: Secondary | ICD-10-CM

## 2022-07-05 NOTE — Progress Notes (Signed)
I have reviewed and agree with note, evaluation, plan.   Team to mail copy of AVS per patient request  Garret Reddish, MD

## 2022-07-12 ENCOUNTER — Other Ambulatory Visit: Payer: Self-pay | Admitting: Cardiovascular Disease

## 2022-07-30 ENCOUNTER — Ambulatory Visit: Payer: Medicare Other | Attending: Cardiovascular Disease | Admitting: Cardiovascular Disease

## 2022-07-30 ENCOUNTER — Encounter: Payer: Self-pay | Admitting: Cardiovascular Disease

## 2022-07-30 VITALS — BP 116/70 | HR 65 | Ht 69.0 in | Wt 184.4 lb

## 2022-07-30 DIAGNOSIS — E785 Hyperlipidemia, unspecified: Secondary | ICD-10-CM

## 2022-07-30 DIAGNOSIS — E782 Mixed hyperlipidemia: Secondary | ICD-10-CM | POA: Insufficient documentation

## 2022-07-30 MED ORDER — EZETIMIBE 10 MG PO TABS: ORAL_TABLET | ORAL | 3 refills | Status: DC

## 2022-07-30 MED ORDER — OMEGA-3-ACID ETHYL ESTERS 1 G PO CAPS: ORAL_CAPSULE | ORAL | 11 refills | Status: DC

## 2022-07-30 NOTE — Patient Instructions (Signed)
Medication Instructions:  Your physician recommends that you continue on your current medications as directed. Please refer to the Current Medication list given to you today.  *If you need a refill on your cardiac medications before your next appointment, please call your pharmacy*   Lab Work: NONE If you have labs (blood work) drawn today and your tests are completely normal, you will receive your results only by: MyChart Message (if you have MyChart) OR A paper copy in the mail If you have any lab test that is abnormal or we need to change your treatment, we will call you to review the results.   Testing/Procedures: NONE  Follow-Up: At [facility], you and your health needs are our priority.  As part of our continuing mission to provide you with exceptional heart care, we have created designated Provider Care Teams.  These Care Teams include your primary Cardiologist (physician) and Advanced Practice Providers (APPs -  Physician Assistants and Nurse Practitioners) who all work together to provide you with the care you need, when you need it.  Your next appointment:   1 year(s)  The format for your next appointment:   In Person  Provider:   Michael Cooper, MD       Important Information About Sugar       

## 2022-07-30 NOTE — Progress Notes (Signed)
Cardiology Office Note:    Date:  07/30/2022   ID:  William, Krueger [birth year], MRN [id number]  PCP:  Marin Olp, [place] Prov[facility] Cardiologist:  Sherren Mocha, MD     Referring MD: Marin Olp, MD   Chief Complaint  Patient presents with   Hyperlipidemia    History of Present Illness:    William Krueger is a 76 y.o. male with a hx of mixed hyperlipidemia and family history of coronary artery disease, presenting for follow-up evaluation.  The patient had been managed with lovastatin, niacin, and Zetia in the past.  However, he had chronically elevated CPK levels without any symptoms of myalgias or myositis.  At the time of his visit last year, his lipid-lowering regimen was changed to rosuvastatin and Zetia only.  All other medicines were discontinued.  His follow-up labs were excellent with CPK in the normal range of 78, total cholesterol 102, triglycerides 54, HDL 50, and LDL 41.  The patient is doing well from a cardiac perspective. Today, he denies symptoms of palpitations, chest pain, shortness of breath, orthopnea, PND, lower extremity edema, dizziness, or syncope. He continues to swim for exercise without exertional symptoms.  He did have a kidney stone earlier this year and this was treated without complication.  Past Medical History:  Diagnosis Date   Anxiety    Bipolar 2 disorder ([place])    Mixed hyperlipidemia     Past Surgical History:  Procedure Laterality Date   APPENDECTOMY     age 23   COLONOSCOPY WITH PROPOFOL N/A 10/22/2019   Procedure: COLONOSCOPY WITH PROPOFOL;  Surgeon: Daneil Dolin, MD;  Location: [facility]ITE;  Service: Endoscopy;  Laterality: N/A;  7:30am   EXTRACORPOREAL SHOCK WAVE LITHOTRIPSY Right 12/01/2021   Procedure: EXTRACORPOREAL SHOCK WAVE LITHOTRIPSY (ESWL);  Surgeon: Primus Bravo., MD;  Location: [facility] ORS;  Service: Urology;  Laterality: Right;   INGUINAL HERNIA REPAIR Bilateral    around 2000     Current Medications: Current Meds  Medication Sig   aspirin EC 81 MG tablet Take 81 mg by mouth daily.   clonazePAM (KLONOPIN) 0.5 MG tablet Take 0.5 mg by mouth at bedtime.  Per patient taking 0.25 mg at bedtime   divalproex (DEPAKOTE ER) 500 MG 24 hr tablet Take 500 mg by mouth at bedtime.    escitalopram (LEXAPRO) 20 MG tablet Take 20 mg by mouth daily.   ezetimibe (ZETIA) 10 MG tablet TAKE 1 TABLET BY MOUTH DAILY   metroNIDAZOLE (METROGEL) 1 % gel Apply 1 Application topically daily. As needed for rosacea- through Dr. Delman Cheadle   mupirocin ointment [facility]) 2 % as needed.   omega-3 acid ethyl esters (LOVAZA) 1 g capsule TAKE 1 CAPSULE FOUR TIMES A DAY (CALL DOCTOR OFFICE TO SCHEDULE AN OVERDUE APPOINTMENT WITH DR Burt Knack BEFORE ANYMORE REFILLS. [phone])   rosuvastatin (CRESTOR) 10 MG tablet Take 1 tablet (10 mg total) by mouth daily. Patient needs to keep follow for # 90 day supply.   tamsulosin (FLOMAX) 0.4 MG CAPS capsule Take 1 capsule (0.4 mg total) by mouth daily.   tretinoin (RETIN-A) 0.025 % cream    triamcinolone cream (KENALOG) 0.1 %    UNABLE TO FIND once a week. Med Name: Allergy Shot   Vitamin D, Ergocalciferol, (DRISDOL) 1.25 MG (50000 UNIT) CAPS capsule Take 1 capsule (50,000 Units total) by mouth every Monday.     Allergies:   Fluorouracil and Tetracycline   Social History  Socioeconomic History   Marital status: Married    Spouse name: Erskine Squibb   Number of children: 2   Years of education: Not on file   Highest education level: Doctorate  Occupational History   Not on file  Tobacco Use   Smoking status: Never   Smokeless tobacco: Never  Vaping Use   Vaping Use: Never used  Substance and Sexual Activity   Alcohol use: Yes    Comment: 6-7 glasses wine a week   Drug use: No   Sexual activity: Yes    Partners: Female  Other Topics Concern   Not on file  Social History Narrative   Married. 2 children (91 TJ with 42 sons, 50 year old married- horse and  dogs) in 2023    Lives in [place].    Was in [employer] for 20 years      Retired pulmonology/intensive care- retired 2022   - enjoys allergy as well - side interest      Hobbies: swimming, flys once a week, trainer twice a week   Social Determinants of Corporate investment banker Strain: Low Risk  (07/05/2022)   Overall Financial Resource Strain (CARDIA)    Difficulty of Paying Living Expenses: Not hard at all  Food Insecurity: No Food Insecurity (07/05/2022)   Hunger Vital Sign    Worried About Running Out of Food in the Last Year: Never true    Ran Out of Food in the Last Year: Never true  Transportation Needs: No Transportation Needs (07/05/2022)   PRAPARE - Administrator, Civil Service (Medical): No    Lack of Transportation (Non-Medical): No  Physical Activity: Sufficiently Active (07/05/2022)   Exercise Vital Sign    Days of Exercise per Week: 6 days    Minutes of Exercise per Session: 40 min  Stress: No Stress Concern Present (07/05/2022)   [employer] of Occupational Health - Occupational Stress Questionnaire    Feeling of Stress : Only a little  Social Connections: Socially Integrated (07/05/2022)   Social Connection and Isolation Panel [NHANES]    Frequency of Communication with Friends and Family: More than three times a week    Frequency of Social Gatherings with Friends and Family: Twice a week    Attends Religious Services: 1 to 4 times per year    Active Member of [employer] or Organizations: Yes    Attends Engineer, structural: More than 4 times per year    Marital Status: Married     Family History: The patient's family history includes Colon cancer in his maternal aunt; Coronary artery disease in an other family member; Depression in his mother and sister; Heart disease in his father; Hyperlipidemia in his father, sister, and another family member; Hypertension in his sister; Obesity in his sister; Stroke in his mother.  ROS:   Please see the  history of present illness.    All other systems reviewed and are negative.  EKGs/Labs/Other Studies Reviewed:    EKG:  EKG is ordered today.  The ekg ordered today demonstrates NSR 65 bpm, within normal limits  Recent Labs: 05/05/2022: Hemoglobin 13.3; Platelets 199  Recent Lipid Panel    Component Value Date/Time   CHOL 113 05/06/2009 0000   TRIG 31 05/06/2009 0000   HDL 52 05/06/2009 0000   LDLCALC 55 05/06/2009 0000     Risk Assessment/Calculations:                Physical Exam:    VS:  BP 116/70   Pulse 65   Ht 5\' 9"  (1.753 m)   Wt 184 lb 6.4 oz (83.6 kg)   SpO2 97%   BMI 27.23 kg/m     Wt Readings from Last 3 Encounters:  07/30/22 184 lb 6.4 oz (83.6 kg)  04/30/22 181 lb 3.2 oz (82.2 kg)  11/27/21 178 lb (80.7 kg)     GEN:  Well nourished, well developed in no acute distress HEENT: Normal NECK: No JVD; No carotid bruits LYMPHATICS: No lymphadenopathy CARDIAC: RRR, no murmurs, rubs, gallops RESPIRATORY:  Clear to auscultation without rales, wheezing or rhonchi  ABDOMEN: Soft, non-tender, non-distended MUSCULOSKELETAL:  No edema; No deformity  SKIN: Warm and dry NEUROLOGIC:  Alert and oriented x 3 PSYCHIATRIC:  Normal affect   ASSESSMENT:    1. Hyperlipidemia    PLAN:    In order of problems listed above:  Continue rosuvastatin and Zetia.  Last year's labs reviewed as above with lipids at goal, LDL cholesterol 41 mg/dL.  CK levels normalized with discontinuation of lovastatin and niacin.  Continue current management.  He should have updated labs this year and states that he will arrange this through primary care.  Continue current management.           Medication Adjustments/Labs and Tests Ordered: Current medicines are reviewed at length with the patient today.  Concerns regarding medicines are outlined above.  No orders of the defined types were placed in this encounter.  No orders of the defined types were placed in this  encounter.   There are no Patient Instructions on file for this visit.   Signed, Sherren Mocha, MD  07/30/2022 10:47 AM    [place]

## 2022-08-04 ENCOUNTER — Other Ambulatory Visit: Payer: Self-pay | Admitting: Physician Assistant

## 2022-08-04 DIAGNOSIS — R911 Solitary pulmonary nodule: Secondary | ICD-10-CM

## 2022-08-12 LAB — CBC: RBC: 4.73 (ref 3.87–5.11)

## 2022-08-12 LAB — BASIC METABOLIC PANEL
BUN: 9 (ref 4–21)
CO2: 28 — AB (ref 13–22)
Chloride: 106 (ref 99–108)
Glucose: 94
Potassium: 4.5 mEq/L (ref 3.5–5.1)
Sodium: 141 (ref 137–147)

## 2022-08-12 LAB — LIPID PANEL
Cholesterol: 100 (ref 0–200)
HDL: 43 (ref 35–70)
LDL Cholesterol: 39
Triglycerides: 92 (ref 40–160)

## 2022-08-12 LAB — PSA: PSA: 0.77

## 2022-08-12 LAB — CBC AND DIFFERENTIAL
HCT: 43 (ref 41–53)
Hemoglobin: 13.4 — AB (ref 13.5–17.5)
Platelets: 186 10*3/uL (ref 150–400)
WBC: 4.1

## 2022-08-12 LAB — TSH: TSH: 2.23 (ref 0.41–5.90)

## 2022-08-12 LAB — COMPREHENSIVE METABOLIC PANEL
Albumin: 3.5 (ref 3.5–5.0)
Calcium: 8.4 — AB (ref 8.7–10.7)

## 2022-09-02 ENCOUNTER — Encounter: Payer: Self-pay | Admitting: Family Medicine

## 2022-09-09 ENCOUNTER — Ambulatory Visit
Admission: RE | Admit: 2022-09-09 | Discharge: 2022-09-09 | Disposition: A | Discharge status: Home / Self Care | Payer: Medicare Other | Source: Ambulatory Visit | Attending: Physician Assistant | Admitting: Physician Assistant

## 2022-09-09 DIAGNOSIS — R911 Solitary pulmonary nodule: Secondary | ICD-10-CM

## 2022-09-13 ENCOUNTER — Ambulatory Visit: Payer: Medicare Other | Admitting: Family Medicine

## 2022-09-28 ENCOUNTER — Other Ambulatory Visit: Payer: Self-pay | Admitting: Cardiovascular Disease

## 2023-05-02 ENCOUNTER — Encounter: Payer: Self-pay | Admitting: Family Medicine

## 2023-05-02 ENCOUNTER — Ambulatory Visit (INDEPENDENT_AMBULATORY_CARE_PROVIDER_SITE_OTHER): Payer: Medicare Other | Admitting: Family Medicine

## 2023-05-02 VITALS — BP 110/74 | HR 69 | Temp 97.8°F | Ht 69.0 in | Wt 180.4 lb

## 2023-05-02 DIAGNOSIS — N401 Enlarged prostate with lower urinary tract symptoms: Secondary | ICD-10-CM

## 2023-05-02 DIAGNOSIS — R351 Nocturia: Secondary | ICD-10-CM

## 2023-05-02 DIAGNOSIS — Z125 Encounter for screening for malignant neoplasm of prostate: Secondary | ICD-10-CM

## 2023-05-02 DIAGNOSIS — I700 Atherosclerosis of aorta: Secondary | ICD-10-CM | POA: Diagnosis not present

## 2023-05-02 DIAGNOSIS — E559 Vitamin D deficiency, unspecified: Secondary | ICD-10-CM | POA: Diagnosis not present

## 2023-05-02 DIAGNOSIS — D649 Anemia, unspecified: Secondary | ICD-10-CM

## 2023-05-02 DIAGNOSIS — F3181 Bipolar II disorder: Secondary | ICD-10-CM | POA: Diagnosis not present

## 2023-05-02 DIAGNOSIS — E782 Mixed hyperlipidemia: Secondary | ICD-10-CM

## 2023-05-02 DIAGNOSIS — Z87448 Personal history of other diseases of urinary system: Secondary | ICD-10-CM

## 2023-05-02 NOTE — Patient Instructions (Addendum)
Let us know when you get your TDAP and COVID vaccine at your pharmacy.  Thanks for getting labs done and sent to [place]- team please give him my card with # on it and also print labs for him  Update your Tetanus, Diphtheria, and Pertussis (Tdap)   Recommended follow up: Return in about 1 year (around 05/01/2024) for physical or sooner if needed.Schedule b4 you leave.

## 2023-05-02 NOTE — Progress Notes (Signed)
Phone [phone] In person visit   Subjective:   William Krueger is a 77 y.o. year old very pleasant male patient who presents for/with See problem oriented charting Chief Complaint  Patient presents with   Annual Exam    Not fasting. (Declines issues/ROS) has had SHINGLES vaccine but unsure of dates as over 10 years ago.    Past Medical History-  Patient Active Problem List   Diagnosis Date Noted   Bipolar II disorder (HCC) 05/11/2012    Priority: High   Elevated CK 04/30/2022    Priority: Medium    Aortic atherosclerosis (HCC) 04/30/2022    Priority: Medium    Acquired complex renal cyst; right, Bosniak 2 10/02/2021    Priority: Medium    Mixed hyperlipidemia 05/05/2009    Priority: Medium    Nephrolithiasis 04/30/2022    Priority: Low   Vitamin D deficiency 04/30/2022    Priority: Low   Derang of medial meniscus due to old tear/inj, right knee 02/13/2015    Priority: Low   BPH associated with nocturia 04/30/2022    Medications- reviewed and updated Current Outpatient Medications  Medication Sig Dispense Refill   aspirin EC 81 MG tablet Take 81 mg by mouth daily.     divalproex (DEPAKOTE ER) 500 MG 24 hr tablet Take 500 mg by mouth at bedtime.      escitalopram (LEXAPRO) 20 MG tablet Take 20 mg by mouth daily.     ezetimibe (ZETIA) 10 MG tablet TAKE 1 TABLET BY MOUTH DAILY 90 tablet 3   metroNIDAZOLE (METROGEL) 1 % gel Apply 1 Application topically daily. As needed for rosacea- through Dr. Emily Filbert     mupirocin ointment [facility]) 2 % as needed.     omega-3 acid ethyl esters (LOVAZA) 1 g capsule TAKE 1 CAPSULE FOUR TIMES A DAY 120 capsule 11   rosuvastatin (CRESTOR) 10 MG tablet TAKE 1 TABLET DAILY (NEED TO KEEP FOLLOW UP FOR 90 DAY SUPPLY) 90 tablet 3   tamsulosin (FLOMAX) 0.4 MG CAPS capsule Take 1 capsule (0.4 mg total) by mouth daily. 90 capsule 3   tretinoin (RETIN-A) 0.025 % cream      triamcinolone cream (KENALOG) 0.1 %      UNABLE TO FIND once a week. Med  Name: Allergy Shot     Vitamin D, Ergocalciferol, (DRISDOL) 1.25 MG (50000 UNIT) CAPS capsule Take 1 capsule (50,000 Units total) by mouth every Monday. 13 capsule 3   clonazePAM (KLONOPIN) 0.5 MG tablet Take 0.5 mg by mouth at bedtime.  Per patient taking 0.25 mg at bedtime (Patient not taking: Reported on 05/02/2023)     No current facility-administered medications for this visit.     Objective:  BP 110/74   Pulse 69   Temp 97.8 F (36.6 C)   Ht 5\' 9"  (1.753 m)   Wt 180 lb 6.4 oz (81.8 kg)   SpO2 98%   BMI 26.64 kg/m  Gen: NAD, resting comfortably Tympanic membrane normal. Oropharynx normal CV: RRR no murmurs rubs or gallops Lungs: CTAB no crackles, wheeze, rhonchi Abdomen: soft/nontender/nondistended/normal bowel sounds. No rebound or guarding.  Ext: no edema Skin: warm, dry Neuro: grossly normal, moves all extremities Defers rectal and GU exam     Assessment and Plan   # Bipolar II/anxiety- managed by Dr. Gennie Alma with [facility]  S:medication: Depakote 500mg  24 hr daily, lexapro 20 mg, clonazepam 0.5 mg just at night  -just saw on friday A/P: reports good control - continue current medications and  follow up with psychiatry   #hyperlipidemia/hypertriglyceridemia S: Medication:Rosuvastatin 10 mg daily, Zetia 10 mg daily, Lovaza -has had elevated CK in past without myositis (also sees Dr. Abel Presto for renal cyst checked yearly)  - normal in 2022 -Cardiology-follows with Dr. Excell Seltzer Lab Results  Component Value Date   CHOL 100 08/12/2022   HDL 43 08/12/2022   LDLCALC 39 08/12/2022   TRIG 92 08/12/2022   A/P: #s looked great in November- we will repeat with labs today- continue current medications for now   # BPH S:medication: tamsulosin 0.4 mg  A/P: reasonable control continue current medications    #Vitamin D deficiency S: Medication: 50k units per week -even with high dose for years remains around the 30s or 40's- likely has an absorption issue Last vitamin D Lab  Results  Component Value Date   VD25OH 49.8 05/06/2009  A/P: hopefully stable- update vitamin D today. Continue current meds for now    #Nephrolithiasis-follows with Dr. Eliott Nine with [facility]- yearly follow up. Initially presented with gross hematuria- had full workup led to stone being found- lithotripsy did not remove- ended up having retrival with Dr. Eliott Nine [facility].  - reports no further stones - gets [place] every December  #complex renal cyst- follows with DR. Colodonato with MRI yearly  -he also placed him on slow iron and he has tolerated well- as was mildly anemic and reports #s improved  #superficial staph infection on nose- looking better on mupirocin - he's ok at moment and on exam looks good/healing  #Health maintenance  Discussed Shingrix- has already had this, flu shot (plans on this), COVID vaccine at pharmacy as well as prevnar 20  (opts out- see below) , Tetanus, Diphtheria, and Pertussis (Tdap) needed. Reports had bad reactoin to pneumovax years ago  Immunization History  Administered Date(s) Administered   Influenza-Unspecified 06/13/2013, 06/20/2014   Pneumococcal Conjugate-13 10/11/2013  2.  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  3. He prefers to monitor PSA with labs with overall good health 4. Past age based screening recommendations for colon cancer from Dr. Russella Dar- reports had 3 years ago with Dr. Russella Dar. Hed be open to doing another 5. Never smoker- does not need lung cancer screening  Recommended follow up: Return in about 1 year (around 05/01/2024) for physical or sooner if needed.Schedule b4 you leave.  Lab/Order associations:   ICD-10-CM   1. Bipolar II disorder (HCC)  F31.81     2. Mixed hyperlipidemia  E78.2     3. Aortic atherosclerosis (HCC)  I70.0     4. Vitamin D deficiency  E55.9     5. BPH associated with nocturia  N40.1    R35.1     6. Anemia, unspecified type  D64.9     7. History  of hematuria  Z87.448     8. Screening for prostate cancer  Z12.5       No orders of the defined types were placed in this encounter.   Return precautions advised.  Tana Conch, MD

## 2023-07-25 ENCOUNTER — Other Ambulatory Visit: Payer: Self-pay | Admitting: Cardiovascular Disease

## 2023-07-25 DIAGNOSIS — E785 Hyperlipidemia, unspecified: Secondary | ICD-10-CM

## 2023-08-20 ENCOUNTER — Other Ambulatory Visit: Payer: Self-pay | Admitting: Family Medicine

## 2023-09-01 ENCOUNTER — Other Ambulatory Visit: Payer: Self-pay | Admitting: Cardiovascular Disease

## 2023-09-12 ENCOUNTER — Encounter: Payer: Self-pay | Admitting: Cardiovascular Disease

## 2023-09-12 ENCOUNTER — Ambulatory Visit: Payer: Medicare Other | Attending: Cardiovascular Disease | Admitting: Cardiovascular Disease

## 2023-09-12 VITALS — BP 130/86 | HR 59 | Ht 69.0 in | Wt 184.2 lb

## 2023-09-12 DIAGNOSIS — E782 Mixed hyperlipidemia: Secondary | ICD-10-CM | POA: Insufficient documentation

## 2023-09-12 DIAGNOSIS — R001 Bradycardia, unspecified: Secondary | ICD-10-CM

## 2023-09-12 NOTE — Progress Notes (Signed)
Cardiology Office Note:    Date:  09/12/2023   ID:  William, Krueger [birth year], MRN [id number]  PCP:  Shelva Majestic, MD   [facility] Providers Cardiologist:  Tonny Bollman, MD     Referring MD: Shelva Majestic, MD   Chief Complaint  Patient presents with   Hyperlipidemia    History of Present Illness:    William Krueger is a 77 y.o. male with a hx of mixed hyperlipidemia, presenting for follow-up evaluation.   He is here alone today. He recently moved to [place] to be closer to his daughter. They are living in a community in [place].  The patient is doing well from a cardiac perspective.  He remains physically active and exercises about 4 days/week.  He denies chest pain, chest pressure, or shortness of breath.  He has no exertional symptoms.  He denies heart palpitations.  His medications are unchanged and his lipid-lowering program includes rosuvastatin and ezetimibe.  Current Medications: Current Meds  Medication Sig   clonazePAM (KLONOPIN) 0.5 MG tablet Take 0.5 mg by mouth at bedtime.  Per patient taking 0.25 mg at bedtime   divalproex (DEPAKOTE ER) 500 MG 24 hr tablet Take 500 mg by mouth at bedtime.    escitalopram (LEXAPRO) 20 MG tablet Take 20 mg by mouth daily.   ezetimibe (ZETIA) 10 MG tablet Take 1 tablet (10 mg total) by mouth daily. Please keeps scheduled appointment for future refills. Thank you.   metroNIDAZOLE (METROGEL) 1 % gel Apply 1 Application topically daily. As needed for rosacea- through Dr. Emily Filbert   mupirocin ointment [facility]) 2 % as needed.   omega-3 acid ethyl esters (LOVAZA) 1 g capsule TAKE 1 CAPSULE FOUR TIMES A DAY   rosuvastatin (CRESTOR) 10 MG tablet TAKE 1 TABLET DAILY (NEED TO KEEP FOLLOW UP FOR 90 DAY SUPPLY)   tamsulosin (FLOMAX) 0.4 MG CAPS capsule Take 1 capsule (0.4 mg total) by mouth daily.   tretinoin (RETIN-A) 0.025 % cream    triamcinolone cream (KENALOG) 0.1 %    Vitamin D, Ergocalciferol, (DRISDOL) 1.25 MG  (50000 UNIT) CAPS capsule TAKE 1 CAPSULE EVERY MONDAY     Allergies:   Fluorouracil and Tetracycline   ROS:   Please see the history of present illness.    All other systems reviewed and are negative.  EKGs/Labs/Other Studies Reviewed:    The following studies were reviewed today:     EKG:   EKG Interpretation Date/Time:  Monday September 12 2023 08:21:51 EST Ventricular Rate:  59 PR Interval:  198 QRS Duration:  80 QT Interval:  432 QTC Calculation: 427 R Axis:   72  Text Interpretation: Sinus bradycardia No previous ECGs available Confirmed by Tonny Bollman [phone]) on 09/12/2023 8:29:54 AM    Recent Labs: No results found for requested labs within last 365 days.  Recent Lipid Panel    Component Value Date/Time   CHOL 100 08/12/2022 0000   TRIG 92 08/12/2022 0000   TRIG 31 05/06/2009 0000   HDL 43 08/12/2022 0000   LDLCALC 39 08/12/2022 0000   LDLCALC 55 05/06/2009 0000     Risk Assessment/Calculations:                Physical Exam:    VS:  BP 130/86   Pulse (!) 59   Ht 5\' 9"  (1.753 m)   Wt 184 lb 3.2 oz (83.6 kg)   SpO2 100%   BMI 27.20 kg/m     Wt  Readings from Last 3 Encounters:  09/12/23 184 lb 3.2 oz (83.6 kg)  05/02/23 180 lb 6.4 oz (81.8 kg)  07/30/22 184 lb 6.4 oz (83.6 kg)     GEN:  Well nourished, well developed in no acute distress HEENT: Normal NECK: No JVD; No carotid bruits LYMPHATICS: No lymphadenopathy CARDIAC: RRR, no murmurs, rubs, gallops RESPIRATORY:  Clear to auscultation without rales, wheezing or rhonchi  ABDOMEN: Soft, non-tender, non-distended MUSCULOSKELETAL:  No edema; No deformity  SKIN: Warm and dry NEUROLOGIC:  Alert and oriented x 3 PSYCHIATRIC:  Normal affect   Assessment & Plan Mixed hyperlipidemia Treated with rosuvastatin and ezetimibe. Last year's labs reviewed with total cholesterol 100, LDL 39, HDL 43, trig 92.  Patient follows a healthy lifestyle.  He had a full lab panel drawn about 2 months ago and  we will send for those results.  He will continue on his medical program.  He reports average blood pressure readings in the 120s over 70s.We discussed any red flag symptoms of cardiovascular disease and he will seek immediate medical attention if these occur.  Otherwise, I will plan to see him back in 1 year for follow-up evaluation.        Medication Adjustments/Labs and Tests Ordered: Current medicines are reviewed at length with the patient today.  Concerns regarding medicines are outlined above.  Orders Placed This Encounter  Procedures   EKG 12-Lead   No orders of the defined types were placed in this encounter.   There are no Patient Instructions on file for this visit.   Signed, Tonny Bollman, MD  09/12/2023 8:30 AM    [facility]

## 2023-09-12 NOTE — Assessment & Plan Note (Addendum)
Treated with rosuvastatin and ezetimibe. Last year's labs reviewed with total cholesterol 100, LDL 39, HDL 43, trig 92.  Patient follows a healthy lifestyle.  He had a full lab panel drawn about 2 months ago and we will send for those results.  He will continue on his medical program.  He reports average blood pressure readings in the 120s over 70s.We discussed any red flag symptoms of cardiovascular disease and he will seek immediate medical attention if these occur.  Otherwise, I will plan to see him back in 1 year for follow-up evaluation.

## 2023-09-12 NOTE — Patient Instructions (Signed)
  At [facility], you and your health needs are our priority.  As part of our continuing mission to provide you with exceptional heart care, we have created designated Provider Care Teams.  These Care Teams include your primary Cardiologist (physician) and Advanced Practice Providers (APPs -  Physician Assistants and Nurse Practitioners) who all work together to provide you with the care you need, when you need it.  Your next appointment:   1 year(s)  Provider:   Tonny Bollman, MD

## 2023-09-19 ENCOUNTER — Other Ambulatory Visit: Payer: Self-pay | Admitting: Physician Assistant

## 2023-09-19 DIAGNOSIS — R911 Solitary pulmonary nodule: Secondary | ICD-10-CM

## 2023-09-21 ENCOUNTER — Ambulatory Visit: Payer: Medicare Other

## 2023-09-21 VITALS — Wt 184.0 lb

## 2023-09-21 DIAGNOSIS — Z0000 Encounter for general adult medical examination without abnormal findings: Secondary | ICD-10-CM | POA: Diagnosis not present

## 2023-09-21 NOTE — Progress Notes (Signed)
Subjective:   William Krueger is a 77 y.o. male who presents for Medicare Annual/Subsequent preventive examination.  Visit Complete: Virtual I connected with  Havery Moros on 09/21/23 by a audio enabled telemedicine application and verified that I am speaking with the correct person using two identifiers.  Patient Location: Home  Provider Location: Home Office  I discussed the limitations of evaluation and management by telemedicine. The patient expressed understanding and agreed to proceed.  Vital Signs: Because this visit was a virtual/telehealth visit, some criteria may be missing or patient reported. Any vitals not documented were not able to be obtained and vitals that have been documented are patient reported.  Patient Medicare AWV questionnaire was completed by the patient on 09/17/23; I have confirmed that all information answered by patient is correct and no changes since this date.  Cardiac Risk Factors include: advanced age (>62men, >24 women);dyslipidemia;male gender     Objective:    Today's Vitals   09/21/23 1104  Weight: 184 lb (83.5 kg)   Body mass index is 27.17 kg/m.     09/21/2023   11:09 AM 07/05/2022    9:31 AM 12/01/2021    8:01 AM 11/27/2021    1:03 PM 10/22/2019    6:30 AM  Advanced Directives  Does Patient Have a Medical Advance Directive? Yes Yes Yes No No  Type of Estate agent of [place];Living will Healthcare Power of [place];Living will Healthcare Power of [place];Living will    Does patient want to make changes to medical advance directive?  No - Patient declined No - Patient declined    Copy of Healthcare Power of Attorney in Chart? No - copy requested No - copy requested No - copy requested    Would patient like information on creating a medical advance directive?    No - Patient declined No - Patient declined    Current Medications (verified) Outpatient Encounter Medications as of 09/21/2023  Medication Sig    clonazePAM (KLONOPIN) 0.5 MG tablet Take 0.5 mg by mouth at bedtime.  Per patient taking 0.25 mg at bedtime   divalproex (DEPAKOTE ER) 500 MG 24 hr tablet Take 500 mg by mouth at bedtime.    escitalopram (LEXAPRO) 20 MG tablet Take 20 mg by mouth daily.   ezetimibe (ZETIA) 10 MG tablet Take 1 tablet (10 mg total) by mouth daily. Please keeps scheduled appointment for future refills. Thank you.   metroNIDAZOLE (METROGEL) 1 % gel Apply 1 Application topically daily. As needed for rosacea- through Dr. Emily Filbert   mupirocin ointment [facility]) 2 % as needed.   omega-3 acid ethyl esters (LOVAZA) 1 g capsule TAKE 1 CAPSULE FOUR TIMES A DAY   rosuvastatin (CRESTOR) 10 MG tablet TAKE 1 TABLET DAILY (NEED TO KEEP FOLLOW UP FOR 90 DAY SUPPLY)   tamsulosin (FLOMAX) 0.4 MG CAPS capsule Take 1 capsule (0.4 mg total) by mouth daily.   tretinoin (RETIN-A) 0.025 % cream    triamcinolone cream (KENALOG) 0.1 %    Vitamin D, Ergocalciferol, (DRISDOL) 1.25 MG (50000 UNIT) CAPS capsule TAKE 1 CAPSULE EVERY MONDAY   No facility-administered encounter medications on file as of 09/21/2023.    Allergies (verified) Fluorouracil and Tetracycline   History: Past Medical History:  Diagnosis Date   Anxiety    Bipolar 2 disorder (HCC)    Mixed hyperlipidemia    Past Surgical History:  Procedure Laterality Date   APPENDECTOMY     age 15   COLONOSCOPY WITH PROPOFOL N/A 10/22/2019  Procedure: COLONOSCOPY WITH PROPOFOL;  Surgeon: Corbin Ade, MD;  Location: [facility];  Service: Endoscopy;  Laterality: N/A;  7:30am   EXTRACORPOREAL SHOCK WAVE LITHOTRIPSY Right 12/01/2021   Procedure: EXTRACORPOREAL SHOCK WAVE LITHOTRIPSY (ESWL);  Surgeon: Milderd Meager., MD;  Location: [facility] ORS;  Service: Urology;  Laterality: Right;   INGUINAL HERNIA REPAIR Bilateral    around 2000   Family History  Problem Relation Age of Onset   Depression Mother        he thinks schizophrenic. improved after his fathers death  interestingly   Stroke Mother        lived into late 37s- stroke, sepsis, MI later inlife   Heart disease Father        died at 58, other brothers died in 35s   Hyperlipidemia Father    Obesity Sister    Hypertension Sister    Hyperlipidemia Sister    Depression Sister    Colon cancer Maternal Aunt        Diagnosed at age greater than 76.     Coronary artery disease Other        family history- multiple uncles on dads side   Hyperlipidemia Other        family history   Social History   Socioeconomic History   Marital status: Married    Spouse name: Erskine Squibb   Number of children: 2   Years of education: Not on file   Highest education level: Doctorate  Occupational History   Not on file  Tobacco Use   Smoking status: Never   Smokeless tobacco: Never  Vaping Use   Vaping status: Never Used  Substance and Sexual Activity   Alcohol use: Yes    Comment: 6-7 glasses wine a week   Drug use: No   Sexual activity: Yes    Partners: Female  Other Topics Concern   Not on file  Social History Narrative   Married. 2 children (42 TJ with 47 sons, 70 year old married- horse and dogs) in 2023    Lives in [place].    Was in [employer] for 20 years      Retired pulmonology/intensive care- retired 2022   - enjoys allergy as well - side interest      Hobbies: swimming, flys once a week, trainer twice a week   Social Drivers of Corporate investment banker Strain: Low Risk  (09/17/2023)   Overall Financial Resource Strain (CARDIA)    Difficulty of Paying Living Expenses: Not hard at all  Food Insecurity: No Food Insecurity (09/17/2023)   Hunger Vital Sign    Worried About Running Out of Food in the Last Year: Never true    Ran Out of Food in the Last Year: Never true  Transportation Needs: No Transportation Needs (09/17/2023)   PRAPARE - Administrator, Civil Service (Medical): No    Lack of Transportation (Non-Medical): No  Physical Activity: Sufficiently Active (09/17/2023)    Exercise Vital Sign    Days of Exercise per Week: 5 days    Minutes of Exercise per Session: 70 min  Stress: No Stress Concern Present (09/17/2023)   [employer] of Occupational Health - Occupational Stress Questionnaire    Feeling of Stress : Not at all  Social Connections: Unknown (09/17/2023)   Social Connection and Isolation Panel [NHANES]    Frequency of Communication with Friends and Family: More than three times a week    Frequency of Social Gatherings with  Friends and Family: Once a week    Attends Religious Services: Not on file    Active Member of Clubs or Organizations: No    Attends Engineer, structural: 1 to 4 times per year    Marital Status: Married    Tobacco Counseling Counseling given: Not Answered   Clinical Intake:  Pre-visit preparation completed: Yes  Pain : No/denies pain     BMI - recorded: 27.17 Nutritional Status: BMI 25 -29 Overweight Diabetes: No  How often do you need to have someone help you when you read instructions, pamphlets, or other written materials from your doctor or pharmacy?: 1 - Never  Interpreter Needed?: No  Information entered by :: Lanier Ensign, LPN   Activities of Daily Living    09/17/2023   11:46 AM  In your present state of health, do you have any difficulty performing the following activities:  Hearing? 0  Vision? 0  Difficulty concentrating or making decisions? 0  Walking or climbing stairs? 0  Dressing or bathing? 0  Doing errands, shopping? 0  Preparing Food and eating ? N  Using the Toilet? N  In the past six months, have you accidently leaked urine? Y  Comment at times  Do you have problems with loss of bowel control? N  Managing your Medications? N  Managing your Finances? N  Housekeeping or managing your Housekeeping? N    Patient Care Team: Shelva Majestic, MD as PCP - General (Family Medicine) Tonny Bollman, MD as PCP - Cardiology (Cardiology)  Indicate any recent  Medical Services you may have received from other than [facility] providers in the past year (date may be approximate).     Assessment:   This is a routine wellness examination for [facility].  Hearing/Vision screen Hearing Screening - Comments:: Pt denies any hearing issues  Vision Screening - Comments:: Pt follows up with dr Clelia Croft and Dr Reece Agar for annual eye exams    Goals Addressed             This Visit's Progress    Patient Stated       Continue with exercise        Depression Screen    09/21/2023   11:09 AM 05/02/2023    9:58 AM 07/05/2022    9:41 AM  PHQ 2/9 Scores  PHQ - 2 Score 0 0 0  PHQ- 9 Score  2     Fall Risk    09/17/2023   11:46 AM 05/02/2023    9:58 AM 07/05/2022    9:32 AM  Fall Risk   Falls in the past year? 0 0 0  Number falls in past yr: 0 0 0  Injury with Fall? 0 0 0  Risk for fall due to : No Fall Risks No Fall Risks History of fall(s)  Follow up Falls prevention discussed Falls evaluation completed Falls evaluation completed    MEDICARE RISK AT HOME: Medicare Risk at Home Any stairs in or around the home?: Yes If so, are there any without handrails?: No Home free of loose throw rugs in walkways, pet beds, electrical cords, etc?: Yes Adequate lighting in your home to reduce risk of falls?: Yes Life alert?: No Use of a cane, walker or w/c?: No Grab bars in the bathroom?: Yes Shower chair or bench in shower?: Yes Elevated toilet seat or a handicapped toilet?: No  TIMED UP AND GO:  Was the test performed?  No    Cognitive Function:  09/21/2023   11:11 AM 07/05/2022    9:33 AM  6CIT Screen  What Year? 0 points 0 points  What month? 0 points 0 points  What time? 0 points 0 points  Count back from 20 0 points 0 points  Months in reverse 0 points 0 points  Repeat phrase 0 points 0 points  Total Score 0 points 0 points    Immunizations Immunization History  Administered Date(s) Administered   Influenza-Unspecified 06/20/2014,  06/04/2021, 06/05/2023   Pneumococcal Conjugate-13 10/11/2013      Flu Vaccine status: Up to date  Pneumococcal vaccine status: Due, Education has been provided regarding the importance of this vaccine. Advised may receive this vaccine at local pharmacy or Health Dept. Aware to provide a copy of the vaccination record if obtained from local pharmacy or Health Dept. Verbalized acceptance and understanding.  Covid-19 vaccine status: Declined, Education has been provided regarding the importance of this vaccine but patient still declined. Advised may receive this vaccine at local pharmacy or Health Dept.or vaccine clinic. Aware to provide a copy of the vaccination record if obtained from local pharmacy or Health Dept. Verbalized acceptance and understanding.  Qualifies for Shingles Vaccine? Yes   Zostavax completed No   Shingrix Completed?: No.    Education has been provided regarding the importance of this vaccine. Patient has been advised to call insurance company to determine out of pocket expense if they have not yet received this vaccine. Advised may also receive vaccine at local pharmacy or Health Dept. Verbalized acceptance and understanding.  Screening Tests Health Maintenance  Topic Date Due   Hepatitis C Screening  Never done   Zoster Vaccines- Shingrix (1 of 2) Never done   COVID-19 Vaccine (1 - 2024-25 season) Never done   Pneumonia Vaccine 38+ Years old (2 of 2 - PPSV23 or PCV20) 05/01/2024 (Originally 10/11/2014)   Medicare Annual Wellness (AWV)  09/20/2024   INFLUENZA VACCINE  Completed   HPV VACCINES  Aged Out   DTaP/Tdap/Td  Discontinued   Colonoscopy  Discontinued    Health Maintenance  Health Maintenance Due  Topic Date Due   Hepatitis C Screening  Never done   Zoster Vaccines- Shingrix (1 of 2) Never done   COVID-19 Vaccine (1 - 2024-25 season) Never done    Colorectal cancer screening: No longer required.   Additional Screening:  Hepatitis C Screening: does  qualify  Vision Screening: Recommended annual ophthalmology exams for early detection of glaucoma and other disorders of the eye. Is the patient up to date with their annual eye exam?  Yes  Who is the provider or what is the name of the office in which the patient attends annual eye exams? Dr Clelia Croft and Dr Reece Agar If pt is not established with a provider, would they like to be referred to a provider to establish care? No .   Dental Screening: Recommended annual dental exams for proper oral hygiene   Community Resource Referral / Chronic Care Management: CRR required this visit?  No   CCM required this visit?  No     Plan:     I have personally reviewed and noted the following in the patient's chart:   Medical and social history Use of alcohol, tobacco or illicit drugs  Current medications and supplements including opioid prescriptions. Patient is not currently taking opioid prescriptions. Functional ability and status Nutritional status Physical activity Advanced directives List of other physicians Hospitalizations, surgeries, and ER visits in previous 12 months Vitals Screenings to  include cognitive, depression, and falls Referrals and appointments  In addition, I have reviewed and discussed with patient certain preventive protocols, quality metrics, and best practice recommendations. A written personalized care plan for preventive services as well as general preventive health recommendations were provided to patient.     Marzella Schlein, LPN   [id number]   After Visit Summary: (MyChart) Due to this being a telephonic visit, the after visit summary with patients personalized plan was offered to patient via MyChart   Nurse Notes: none

## 2023-09-23 ENCOUNTER — Other Ambulatory Visit: Payer: Self-pay | Admitting: Cardiovascular Disease

## 2023-09-23 DIAGNOSIS — E785 Hyperlipidemia, unspecified: Secondary | ICD-10-CM

## 2023-10-02 IMAGING — CT CT CHEST W/O CM
1 of 2 series · 15 of 32 positions shown, 19 images · non-contrast
Comparison: 10/21/2021

CLINICAL DATA: Follow-up lung nodule, left lower lobe surgery.



[Series 6: super d · axial · 0.73mm/px · z∈[-273,+2]mm · 15 of 386 slices shown, 19 images]
[im 21/386  mediastinal]
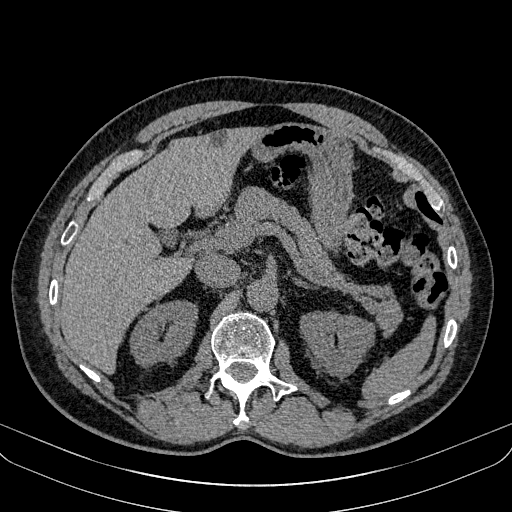
[im 21/386  lung]
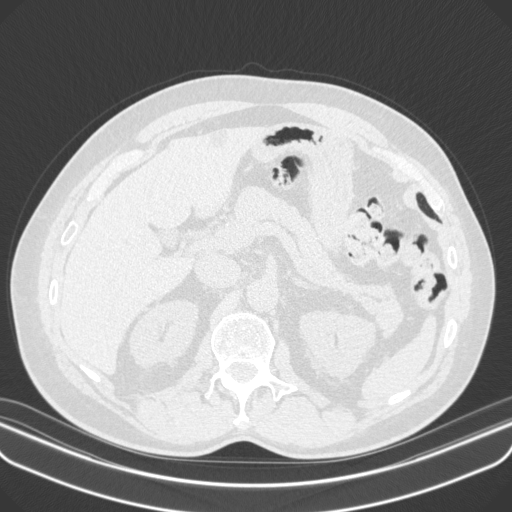
[im 61/386  lung]
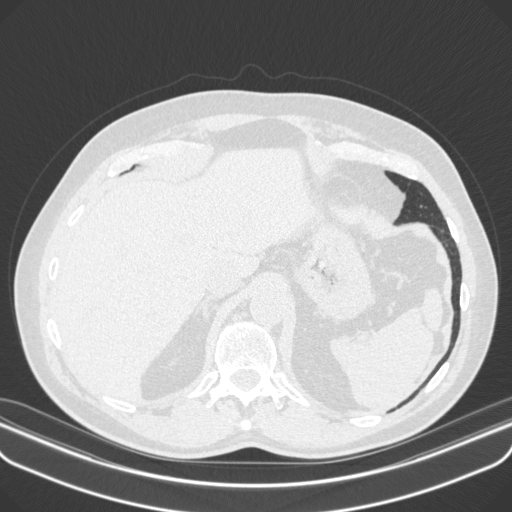
[im 82/386  lung]
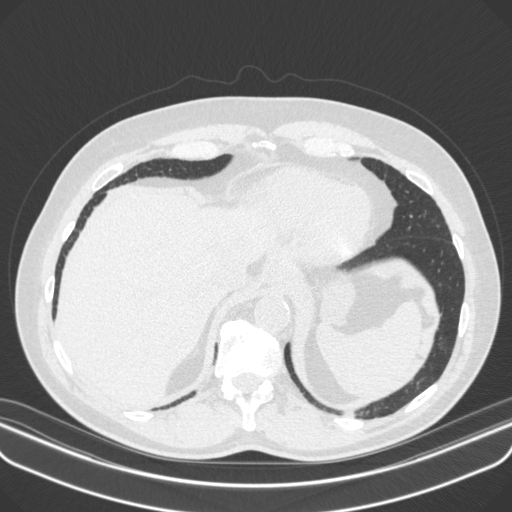
[im 102/386  lung]
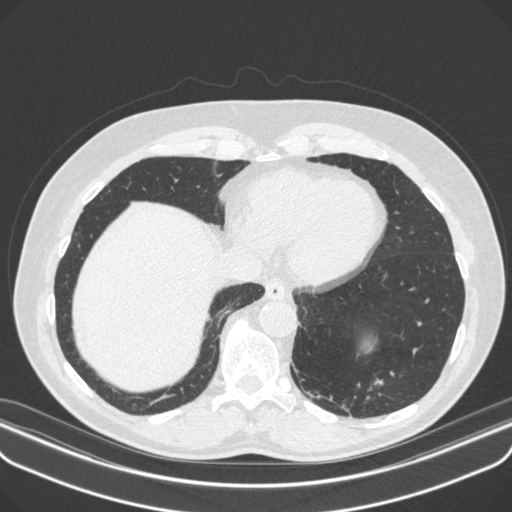
[im 129/386  mediastinal]
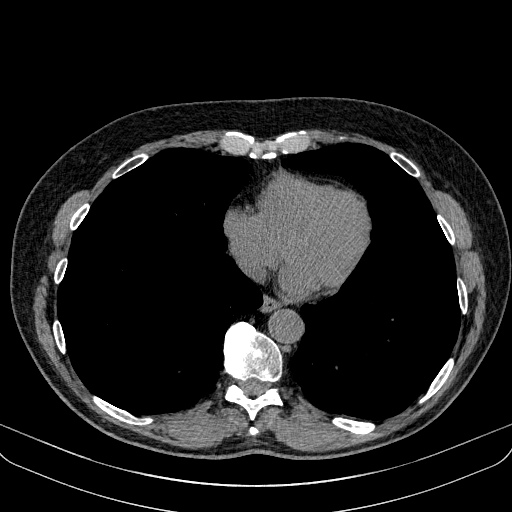
[im 129/386  lung]
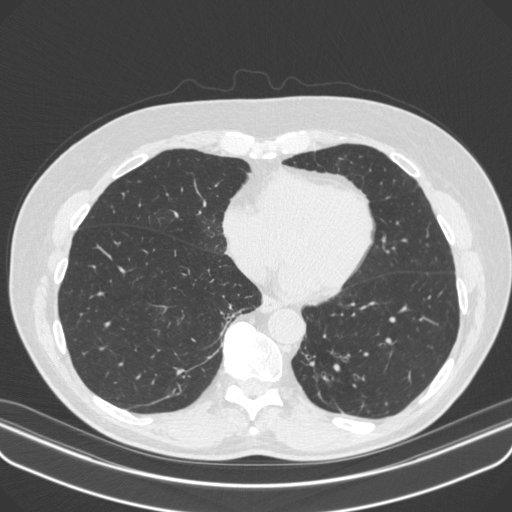
[im 142/386  lung]
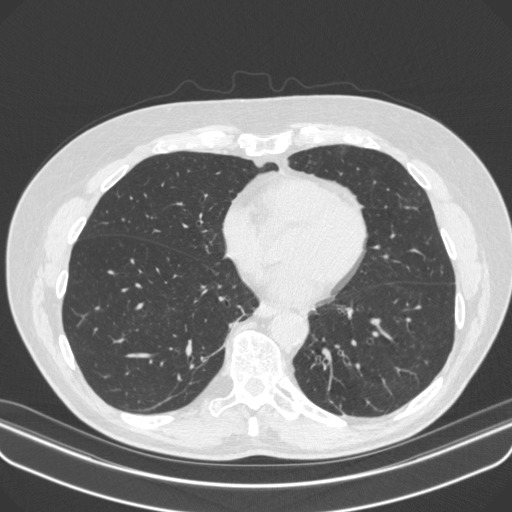
[im 181/386  lung]
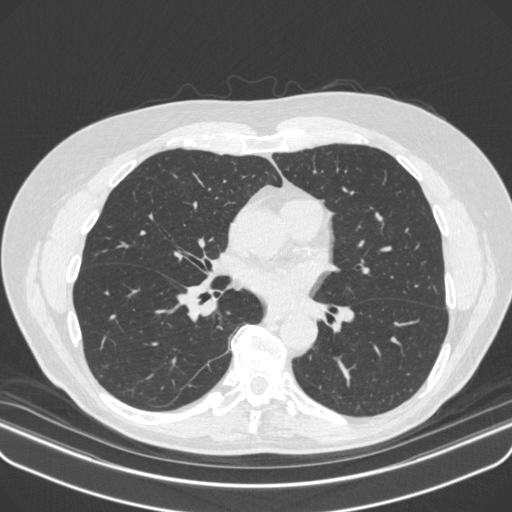
[im 183/386  lung]
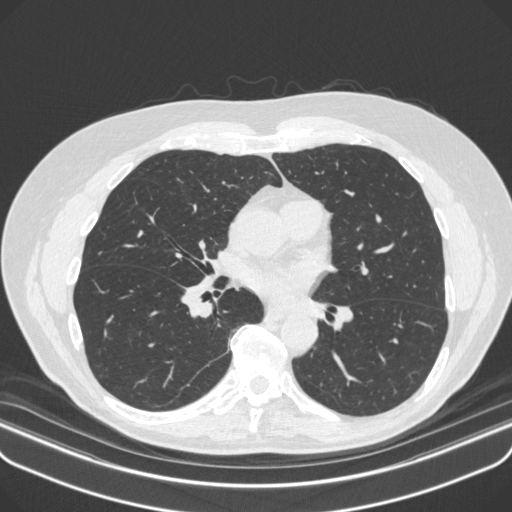
[im 203/386  mediastinal]
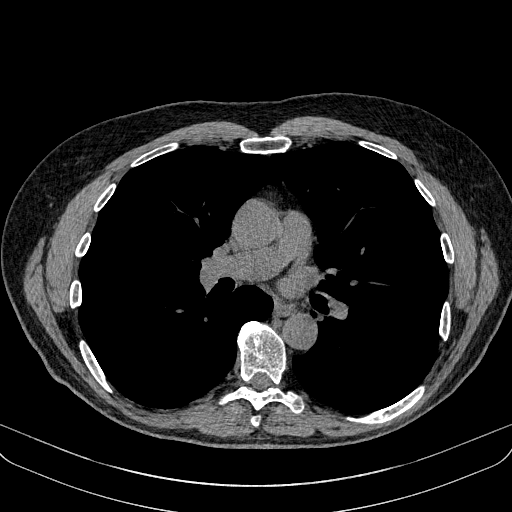
[im 203/386  lung]
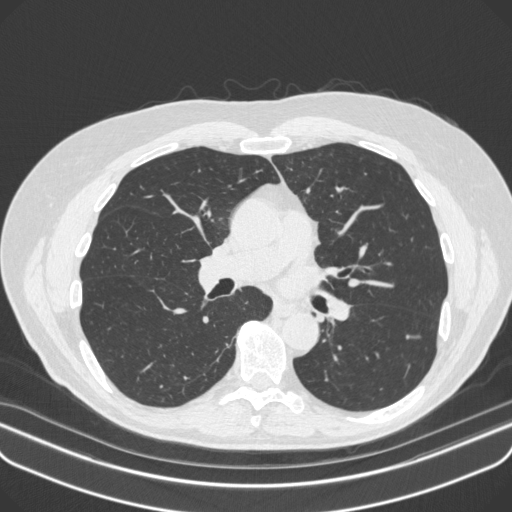
[im 244/386  lung]
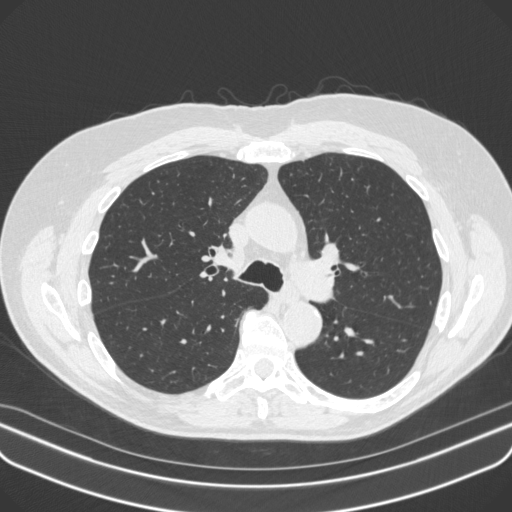
[im 257/386  lung]
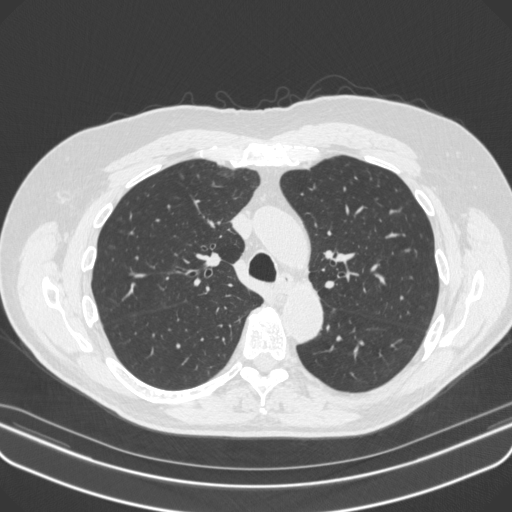
[im 284/386  lung]
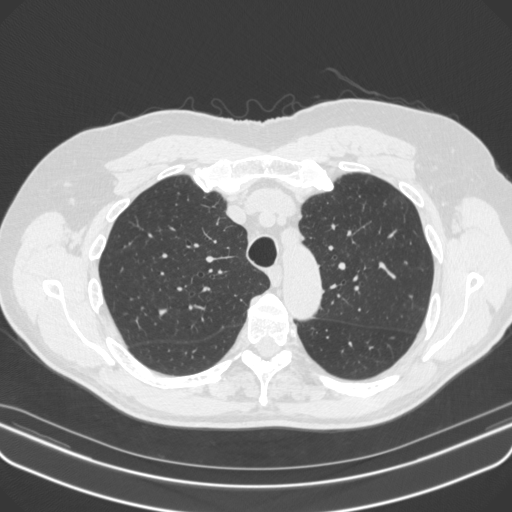
[im 304/386  mediastinal]
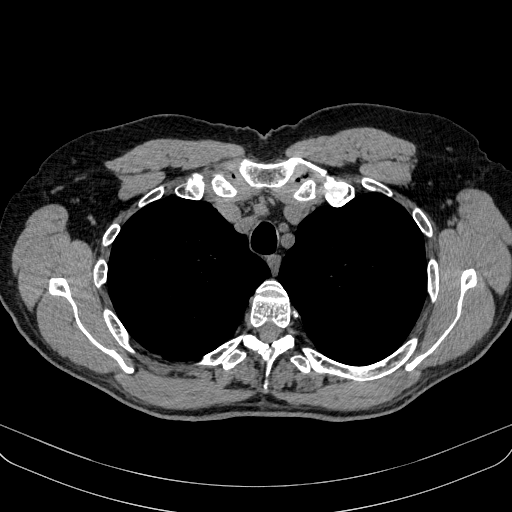
[im 304/386  lung]
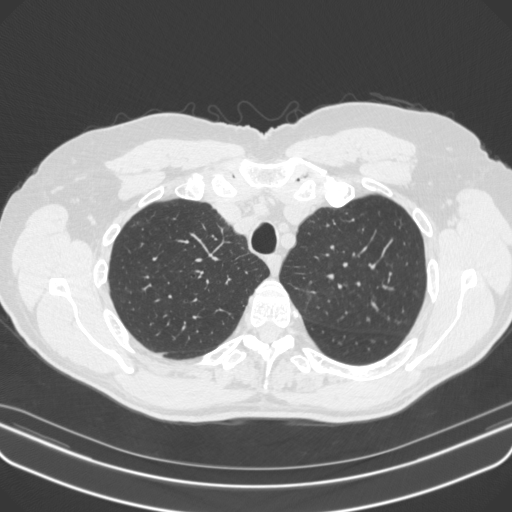
[im 325/386  lung]
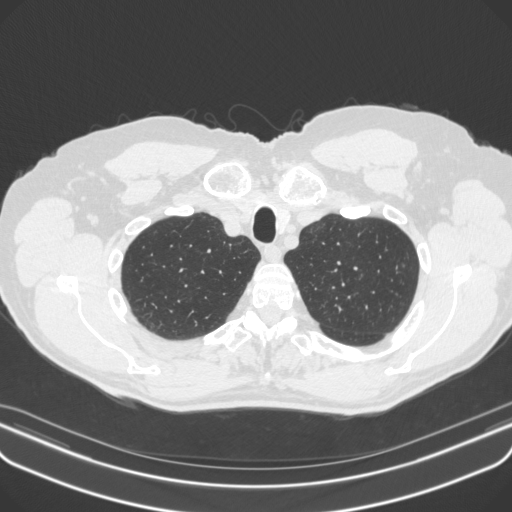
[im 365/386  lung]
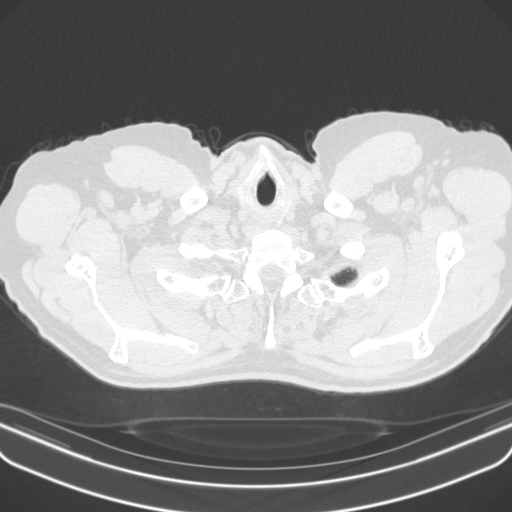

[15 of 32 positions shown; findings below may reference images not displayed]

FINDINGS: Cardiovascular: Heart is normal in size and there is no pericardial
effusion. Scattered coronary artery calcifications are noted. There
is atherosclerotic calcification of the aorta without evidence of
aneurysm. The pulmonary trunk is normal in caliber.

Mediastinum/Nodes: No enlarged mediastinal or axillary lymph nodes.
Thyroid gland, trachea, and esophagus demonstrate no significant
findings.

Lungs/Pleura: Mild atelectasis or scarring is noted at the lung
bases bilaterally. There is a bilobed nodular density in the left
lower lobe measuring 7 mm. No new nodule is identified. No
consolidation, effusion, or pneumothorax.

Upper Abdomen: Cyst is present in the left lobe of the liver
measuring 1.3 cm. A calcification is noted in the mid right kidney.
No acute or abnormality.

Musculoskeletal: Degenerative changes are present in the thoracic
spine. No acute osseous abnormality.
IMPRESSION: 1. Left lower lobe pulmonary nodule measuring 7 mm, decreased in
size from the prior exam. Non-contrast chest CT at 6-12 months is
recommended. If the nodule is stable at time of repeat CT, then
future CT at 18-24 months (from today's scan) is considered optional
for low-risk patients, but is recommended for high-risk patients.
This recommendation follows the consensus statement: Guidelines for
Management of Incidental Pulmonary Nodules Detected on CT Images:
2. Right nephrolithiasis.
3. Hepatic cyst.
4. Aortic atherosclerosis.

## 2023-10-14 ENCOUNTER — Ambulatory Visit
Admission: RE | Admit: 2023-10-14 | Discharge: 2023-10-14 | Disposition: A | Discharge status: Home / Self Care | Payer: Medicare Other | Source: Ambulatory Visit | Attending: Physician Assistant | Admitting: Physician Assistant

## 2023-10-14 DIAGNOSIS — R911 Solitary pulmonary nodule: Secondary | ICD-10-CM

## 2023-11-10 ENCOUNTER — Other Ambulatory Visit: Payer: Self-pay | Admitting: Physician Assistant

## 2023-11-10 DIAGNOSIS — R911 Solitary pulmonary nodule: Secondary | ICD-10-CM

## 2023-11-30 ENCOUNTER — Other Ambulatory Visit: Payer: Self-pay | Admitting: Cardiovascular Disease

## 2024-02-13 ENCOUNTER — Telehealth: Payer: Self-pay | Admitting: Cardiovascular Disease

## 2024-02-13 DIAGNOSIS — E785 Hyperlipidemia, unspecified: Secondary | ICD-10-CM

## 2024-02-13 MED ORDER — OMEGA-3-ACID ETHYL ESTERS 1 G PO CAPS: ORAL_CAPSULE | ORAL | 3 refills | Status: AC

## 2024-02-13 NOTE — Telephone Encounter (Signed)
 Pt's medication was sent to pt's pharmacy as requested. Confirmation received.

## 2024-02-13 NOTE — Telephone Encounter (Signed)
*  STAT* If patient is at the pharmacy, call can be transferred to refill team.   1. Which medications need to be refilled? (please list name of each medication and dose if known)   omega-3 acid ethyl esters (LOVAZA ) 1 g capsule   2. Would you like to learn more about the convenience, safety, & potential cost savings by using the [facility]?   3. Are you open to using the [facility] (Type [facility]. ).  4. Which pharmacy/location (including street and city if local pharmacy) is medication to be sent to?  [facility] - [place], [place] - [address]   5. Do they need a 30 day or 90 day supply?   90 day  Patient stated he still has some medication.

## 2024-04-03 ENCOUNTER — Ambulatory Visit
Admission: RE | Admit: 2024-04-03 | Discharge: 2024-04-03 | Disposition: A | Discharge status: Home / Self Care | Payer: Medicare Other | Source: Ambulatory Visit | Attending: Physician Assistant | Admitting: Physician Assistant

## 2024-04-03 DIAGNOSIS — R911 Solitary pulmonary nodule: Secondary | ICD-10-CM

## 2024-05-04 ENCOUNTER — Ambulatory Visit: Payer: Medicare Other | Admitting: Family Medicine

## 2024-06-11 ENCOUNTER — Other Ambulatory Visit: Payer: Self-pay | Admitting: Family Medicine

## 2024-06-11 NOTE — Telephone Encounter (Signed)
 Refill or swap to otc?
